# Patient Record
Sex: Male | Born: 1960 | Race: White | Hispanic: No | Marital: Single | Smoking: Current some day smoker
Health system: Southern US, Community
[De-identification: ages and names within clinical notes are randomized; demographics above are authoritative.]

## PROBLEM LIST (undated history)

## (undated) DIAGNOSIS — J939 Pneumothorax, unspecified: Secondary | ICD-10-CM

## (undated) DIAGNOSIS — J189 Pneumonia, unspecified organism: Secondary | ICD-10-CM

## (undated) DIAGNOSIS — J45909 Unspecified asthma, uncomplicated: Secondary | ICD-10-CM

## (undated) HISTORY — PX: OTHER SURGICAL HISTORY: SHX169

## (undated) HISTORY — DX: Unspecified asthma, uncomplicated: J45.909

## (undated) HISTORY — DX: Pneumonia, unspecified organism: J18.9

---

## 2011-03-12 ENCOUNTER — Telehealth: Payer: Self-pay

## 2011-03-12 NOTE — Telephone Encounter (Signed)
LMOM to call.

## 2011-03-17 NOTE — Telephone Encounter (Signed)
Called, LMOM to call. Letter mailed also.

## 2011-03-19 ENCOUNTER — Telehealth: Payer: Self-pay

## 2011-03-19 NOTE — Telephone Encounter (Signed)
Pt returned call and left message to call him around 9:00 AM on Thurs and he will return the call. ( he can't have a phone at the site of the job he is on.)

## 2011-03-20 NOTE — Telephone Encounter (Signed)
I called pt about 9:30 AM and he said that he will call me back after 2:00 PM. Today.

## 2011-03-27 NOTE — Telephone Encounter (Signed)
Letter mailed to pt to call.  

## 2013-05-17 ENCOUNTER — Ambulatory Visit (INDEPENDENT_AMBULATORY_CARE_PROVIDER_SITE_OTHER): Payer: BC Managed Care – PPO | Admitting: General Surgery

## 2013-05-23 ENCOUNTER — Ambulatory Visit (INDEPENDENT_AMBULATORY_CARE_PROVIDER_SITE_OTHER): Payer: BC Managed Care – PPO | Admitting: General Surgery

## 2013-05-23 ENCOUNTER — Encounter (INDEPENDENT_AMBULATORY_CARE_PROVIDER_SITE_OTHER): Payer: Self-pay | Admitting: General Surgery

## 2013-05-23 VITALS — BP 134/76 | HR 72 | Temp 97.4°F | Ht 67.0 in | Wt 187.0 lb

## 2013-05-23 DIAGNOSIS — L723 Sebaceous cyst: Secondary | ICD-10-CM

## 2013-05-23 NOTE — Progress Notes (Signed)
Patient ID: Paul Mcdaniel, male   DOB: 21-Dec-1960, 53 y.o.   MRN: [id number]  Chief Complaint  Patient presents with  . eval knot on back    HPI Paul Mcdaniel is a 53 y.o. male.  The patient is a 53 year old white male who has had a knot on his left chest and back for a number of years. Recently he has been wearing harness at work and these areas have been getting rubbed by the harness. He has not had any fevers or discharge from either of these areas. Because they are starting to rub on his clothing he would like to have them removed.  HPI  Past Medical History  Diagnosis Date  . Asthma     History reviewed. No pertinent past surgical history.  History reviewed. No pertinent family history.  Social History History  Substance Use Topics  . Smoking status: Current Some Day Smoker -- 0.50 packs/day    Types: Cigarettes  . Smokeless tobacco: Not on file  . Alcohol Use: No    No Known Allergies  No current outpatient prescriptions on file.   No current facility-administered medications for this visit.    Review of Systems Review of Systems  Constitutional: Negative.   HENT: Negative.   Eyes: Negative.   Respiratory: Negative.   Cardiovascular: Negative.   Gastrointestinal: Negative.   Endocrine: Negative.   Genitourinary: Negative.   Musculoskeletal: Negative.   Skin: Negative.   Allergic/Immunologic: Negative.   Neurological: Negative.   Hematological: Negative.   Psychiatric/Behavioral: Negative.     Blood pressure 134/76, pulse 72, temperature 97.4 F (36.3 C), height 5\' 7"  (1.702 m), weight 187 lb (84.823 kg).  Physical Exam Physical Exam  Constitutional: He is oriented to person, place, and time. He appears well-developed and well-nourished.  HENT:  Head: Normocephalic and atraumatic.  Eyes: Conjunctivae and EOM are normal. Pupils are equal, round, and reactive to light.  Neck: Normal range of motion. Neck supple.  Cardiovascular: Normal rate, regular  rhythm and normal heart sounds.   Pulmonary/Chest: Effort normal and breath sounds normal.  There is a 2 cm mobile sebaceous cyst in the left upper chest and the shoulder. There is also about a 3 cm sebaceous cyst on the mid back. There is no sign of infection at either site.  Abdominal: Soft. Bowel sounds are normal.  Musculoskeletal: Normal range of motion.  Neurological: He is alert and oriented to person, place, and time.  Skin: Skin is warm and dry.  Psychiatric: He has a normal mood and affect. His behavior is normal.    Data Reviewed As above  Assessment    The patient has a moderate to large size sebaceous cyst on his left chest and mid back that causing significant irritation. Because of this he would like to have these removed and I think this is a reasonable thing to do. I have explained to him in detail the risks and benefits of the operation to remove the cyst as well as some of the technical aspects and he understands and wishes to proceed     Plan    Plan for excision of sebaceous cyst from the left chest wall and mid back        Caleen EssexPaul S Toth III 05/23/2013, 10:37 AM

## 2014-02-07 ENCOUNTER — Telehealth: Payer: Self-pay

## 2014-02-07 NOTE — Telephone Encounter (Signed)
Pt called and said that he is having some breathing problems at this time, for the last couple of weeks, due to some chemicals he used at work. He will see how his treatments go and call me back around the first of February to try to schedule in early March.  He will also let [facility] know that he has communicated with me. His address had changed and he did not get the letters.

## 2014-02-07 NOTE — Telephone Encounter (Signed)
Per Ginger, [facility] inquiring about pt's colonoscopy. He has had several letters sent ( several referrals) and last was 11/24/2013. He has not responded to any of them and I have just left a VM for him to call.

## 2014-03-14 NOTE — Telephone Encounter (Signed)
Pt has new insurance and will let me know in a couple of weeks about scheduling colonoscopy.

## 2014-04-18 ENCOUNTER — Telehealth: Payer: Self-pay

## 2014-04-18 NOTE — Telephone Encounter (Signed)
LMOM for pt to call to schedule colonoscopy. Also mailed a letter for him to call. [facility]

## 2015-01-05 ENCOUNTER — Encounter (HOSPITAL_COMMUNITY): Payer: Self-pay | Admitting: Emergency Medicine

## 2015-01-05 ENCOUNTER — Emergency Department (HOSPITAL_COMMUNITY): Payer: 59

## 2015-01-05 ENCOUNTER — Emergency Department (HOSPITAL_COMMUNITY)
Admission: EM | Admit: 2015-01-05 | Discharge: 2015-01-05 | Disposition: A | Discharge status: Home / Self Care | Payer: 59 | Attending: Emergency Medicine | Admitting: Emergency Medicine

## 2015-01-05 DIAGNOSIS — Y9389 Activity, other specified: Secondary | ICD-10-CM | POA: Insufficient documentation

## 2015-01-05 DIAGNOSIS — F17210 Nicotine dependence, cigarettes, uncomplicated: Secondary | ICD-10-CM | POA: Insufficient documentation

## 2015-01-05 DIAGNOSIS — X58XXXA Exposure to other specified factors, initial encounter: Secondary | ICD-10-CM | POA: Insufficient documentation

## 2015-01-05 DIAGNOSIS — J45909 Unspecified asthma, uncomplicated: Secondary | ICD-10-CM | POA: Insufficient documentation

## 2015-01-05 DIAGNOSIS — Y9289 Other specified places as the place of occurrence of the external cause: Secondary | ICD-10-CM | POA: Insufficient documentation

## 2015-01-05 DIAGNOSIS — S8991XA Unspecified injury of right lower leg, initial encounter: Secondary | ICD-10-CM | POA: Insufficient documentation

## 2015-01-05 DIAGNOSIS — Y998 Other external cause status: Secondary | ICD-10-CM | POA: Insufficient documentation

## 2015-01-05 HISTORY — DX: Pneumothorax, unspecified: J93.9

## 2015-01-05 MED ORDER — IBUPROFEN 800 MG PO TABS: 800.0000 mg | ORAL_TABLET | Freq: Three times a day (TID) | ORAL | Status: DC

## 2015-01-05 NOTE — ED Notes (Signed)
Pt reports injuring RT knee 1 week ago. Pt reports constant pain and edema unrelieved by elevation, ice, or Aleve. Pain increases with movement. Pt ambulatory.

## 2015-01-05 NOTE — ED Provider Notes (Signed)
CSN: [id number]     Arrival date & time 01/05/15  0831 History  By signing my name below, I, Paul Mcdaniel, attest that this documentation has been prepared under the direction and in the presence of No att. providers found. Electronically Signed: Marica Mcdaniel, ED Scribe. 01/05/2015. 8:53 AM.   Chief Complaint  Patient presents with  . Knee Injury  The history is provided by the patient. No language interpreter was used.   PCP: Pershing Proud HPI Comments: Paul Mcdaniel is a 54 y.o. male who presents to the Emergency Department complaining of traumatic, sudden onset, intermittent, worsening left knee pain onset 8 days ago after stepping off a ladder wrong. Associated Sx include intermittent swelling of left knee after long periods of standing. Pt states while he is able to ambulate, walking and putting weight on the leg makes the pain worse. Pt reports taking Aleve and keeping left leg elevated at home with some relief. Pt denies foot pain, hip pain, abd pain, back pain or any other pain at this time.  Past Medical History  Diagnosis Date  . Asthma   . Pneumothorax    History reviewed. No pertinent past surgical history. No family history on file. Social History  Substance Use Topics  . Smoking status: Current Some Day Smoker -- 0.50 packs/day    Types: Cigarettes  . Smokeless tobacco: None  . Alcohol Use: No    Review of Systems A complete 10 system review of systems was obtained and all systems are negative except as noted in the HPI and PMH.   Allergies  Review of patient's allergies indicates no known allergies.  Home Medications   Prior to Admission medications   Medication Sig Start Date End Date Taking? Authorizing Provider  ibuprofen (ADVIL,MOTRIN) 800 MG tablet Take 1 tablet (800 mg total) by mouth 3 (three) times daily. 01/05/15   Glynn Octave, MD   Triage Vitals: BP 140/91 mmHg  Pulse 98  Temp(Src) 97.7 F (36.5 C) (Oral)  Resp 20  Ht  (1.702 m)   Wt 180 lb (81.647 kg)  BMI 28.19 kg/m2  SpO2 100% Physical Exam  Constitutional: He is oriented to person, place, and time. He appears well-developed and well-nourished. No distress.  HENT:  Head: Normocephalic and atraumatic.  Mouth/Throat: Oropharynx is clear and moist. No oropharyngeal exudate.  Eyes: Conjunctivae and EOM are normal. Pupils are equal, round, and reactive to light.  Neck: Normal range of motion. Neck supple.  No meningismus.  Cardiovascular: Normal rate, regular rhythm, normal heart sounds and intact distal pulses.   No murmur heard. Pulmonary/Chest: Effort normal and breath sounds normal. No respiratory distress.  Abdominal: Soft. There is no tenderness. There is no rebound and no guarding.  Musculoskeletal: He exhibits tenderness. He exhibits no edema.       Right knee: He exhibits decreased range of motion (secondary to pain). He exhibits no effusion. Tenderness found. Lateral joint line tenderness noted.  Left Knee: flexion and extension intact. DP and PT pulses intact. No calf tenderness.  Neurological: He is alert and oriented to person, place, and time. No cranial nerve deficit. He exhibits normal muscle tone. Coordination normal.  No ataxia on finger to nose bilaterally. No pronator drift. 5/5 strength throughout. CN 2-12 intact.Equal grip strength. Sensation intact.   Skin: Skin is warm.  Psychiatric: He has a normal mood and affect. His behavior is normal.  Nursing note and vitals reviewed.   ED Course  Procedures (including critical care time)  DIAGNOSTIC STUDIES: Oxygen Saturation is 100% on ra, nl by my interpretation.    COORDINATION OF CARE: 8:47 AM: Discussed treatment plan which includes imaging with pt at bedside; patient verbalizes understanding and agrees with treatment plan. Pt also offered meds for pain which he declined at this time.   Imaging Review Dg Knee Complete 4 Views Right  01/05/2015  CLINICAL DATA:  Twisting injury last week with  persistent posterolateral knee pain. Limited weight-bearing. EXAM: RIGHT KNEE - COMPLETE 4+ VIEW COMPARISON:  None. FINDINGS: The mineralization and alignment are normal. There is no evidence of acute fracture or dislocation. There are tricompartmental degenerative changes with small osteophytes. No significant knee joint effusion is present. There is a possible chondroid lesion in the proximal tibia. Mild spurring at the quadriceps insertion on the patella noted. IMPRESSION: No acute osseous findings or significant joint effusion. Mild tricompartmental degenerative changes. Electronically Signed   By: Carey BullocksWilliam  Veazey M.D.   On: 01/05/2015 09:12   I have personally reviewed and evaluated these images as part of my medical decision-making.  MDM   Final diagnoses:  Right knee injury, initial encounter   Right knee pain for past 1 week after stepping off of ladder. Pain is constant and worse with weightbearing. He is neurovascularly intact. There is no ligament laxity.  X-rays negative. Neurovascularly intact. Patient given knee immobilizer. He refuses crutches and pain medication.  Recommend rice, elevation, follow-up with sports medicine orthopedics. Return precautions discussed.   I personally performed the services described in this documentation, which was scribed in my presence. The recorded information has been reviewed and is accurate.    Glynn OctaveStephen Kouper Spinella, MD 01/05/15 1524

## 2015-01-05 NOTE — Discharge Instructions (Signed)

## 2015-01-05 NOTE — ED Notes (Signed)
MD at bedside. 

## 2015-01-09 ENCOUNTER — Ambulatory Visit (INDEPENDENT_AMBULATORY_CARE_PROVIDER_SITE_OTHER): Payer: 59 | Admitting: Orthopedic Surgery

## 2015-01-09 ENCOUNTER — Encounter: Payer: Self-pay | Admitting: Orthopedic Surgery

## 2015-01-09 ENCOUNTER — Telehealth: Payer: Self-pay | Admitting: *Deleted

## 2015-01-09 VITALS — Ht 67.0 in | Wt 180.0 lb

## 2015-01-09 DIAGNOSIS — S83281A Other tear of lateral meniscus, current injury, right knee, initial encounter: Secondary | ICD-10-CM | POA: Diagnosis not present

## 2015-01-09 DIAGNOSIS — S8391XA Sprain of unspecified site of right knee, initial encounter: Secondary | ICD-10-CM | POA: Diagnosis not present

## 2015-01-09 DIAGNOSIS — M25561 Pain in right knee: Secondary | ICD-10-CM | POA: Diagnosis not present

## 2015-01-09 MED ORDER — NAPROXEN 500 MG PO TABS: 500.0000 mg | ORAL_TABLET | Freq: Two times a day (BID) | ORAL | Status: DC

## 2015-01-09 NOTE — Telephone Encounter (Signed)
Patient called stating he was going to [facility] but they cannot see him until next month, patient is requesting to go somewhere else so he can start therapy sooner, patient is requesting to go to [place], [place], patient would like to be close to home since [facility].

## 2015-01-09 NOTE — Patient Instructions (Addendum)
Call [facility] therapy dept to schedule therapy visits [phone]  Medication sent to your pharmacy  Wear brace x 4 weeks  Return to work 01/10/15 no climbing

## 2015-01-09 NOTE — Progress Notes (Signed)
  Paul Mcdaniel is a 54 y.o. male   HPI:  Knee Pain: 54 year old male stepped off a ladder in his foot hip-he slid twisting his right knee. He presents with no history of prior symptoms in the right knee. Approximately one week after injury went to the emergency room x-rays there showed 3 compartment arthritis without effusion or osseous acute findings. I reviewed the x-ray and I have an independent report at the end of the documentation. Pain is on the right knee lateral aspect and posterior aspect of the knee at the dull ache it's mild to moderate. Duration as stated. Context stated. Modifying factors appear to be weightbearing.  Treatment to this point include ibuprofen which he thinks might of made his throat swell or at least feel funny any stopped. He was placed in a knee immobilizer and has continued to work.  Review of systems denies fever chills swelling warmth to the joint. Denies numbness or tingling.  Past Medical History  Diagnosis Date  . Asthma   . Pneumothorax    No past surgical history reported   Current outpatient prescriptions:  .  ibuprofen (ADVIL,MOTRIN) 800 MG tablet, Take 1 tablet (800 mg total) by mouth 3 (three) times daily., Disp: 21 tablet, Rfl: 0 .  naproxen sodium (ANAPROX) 220 MG tablet, Take 220 mg by mouth 2 (two) times daily with a meal., Disp: , Rfl:  .  naproxen (NAPROSYN) 500 MG tablet, Take 1 tablet (500 mg total) by mouth 2 (two) times daily with a meal., Disp: 56 tablet, Rfl: 0 Allergies  Allergen Reactions  . Ibuprofen Swelling    Patient states after taking Ibuprofen his throat started feeling tight    reports that he has been smoking Cigarettes.  He has been smoking about 0.50 packs per day. He does not have any smokeless tobacco history on file. He reports that he does not drink alcohol or use illicit drugs. No family history on file.  Left Knee: Normal to inspection with no erythema or effusion or obvious bony abnormalities. Palpation normal  with no warmth or joint line tenderness or patellar tenderness or condyle tenderness. ROM normal in flexion and extension and lower leg rotation. Ligaments with solid consistent endpoints including ACL, PCL, LCL, MCL. Negative Mcmurray's and provocative meniscal tests. Non painful patellar compression. Patellar and quadriceps tendons unremarkable. Hamstring and quadriceps strength is normal.  Right knee inspection tenderness over the lateral joint line posterior lateral joint. Varus knee. No warmth. No patellar tenderness. Range of motion remains normal. Ligaments remain consistently solid with firm endpoints anterior cruciate ligament, PCL, LCL, MCL. Negative McMurray's and provocative meniscal tears. Mild pain with patellar compression. Quadriceps and patellar tendons are nontender and strength is normal.  Neurovascular exam remains intact in both lower extremities with no peripheral edema and warm extremities to touch.  Independent x-ray interpretation 3 compartment arthritis varus knee alignment  Impression knee pain possible sprain possible meniscal tear  Recommend the following Stop ibuprofen start Naprosyn 500 twice a day Hinged knee brace full weightbearing as tolerated Physical therapy 3 times a week for 4 weeks Return to work on 01/10/2015 no climbing Follow-up 4 weeks

## 2015-01-09 NOTE — Telephone Encounter (Signed)
I called patient and made him aware that his physical therapy [facility]. I also provided him with the number [phone].

## 2015-01-09 NOTE — Addendum Note (Signed)
Addended by: Adella HareBOOTHE, Madelon Welsch B on: 01/09/2015 02:43 PM   Modules accepted: Orders

## 2015-01-09 NOTE — Telephone Encounter (Signed)
Please fax new [facility] to [facility] (I just printed) and let patient know, thank you

## 2015-01-16 ENCOUNTER — Ambulatory Visit: Payer: 59 | Attending: Orthopedic Surgery | Admitting: Physical Therapy

## 2015-01-16 DIAGNOSIS — M25561 Pain in right knee: Secondary | ICD-10-CM | POA: Insufficient documentation

## 2015-01-16 DIAGNOSIS — R29898 Other symptoms and signs involving the musculoskeletal system: Secondary | ICD-10-CM | POA: Insufficient documentation

## 2015-01-16 DIAGNOSIS — M24661 Ankylosis, right knee: Secondary | ICD-10-CM | POA: Diagnosis present

## 2015-01-16 NOTE — Therapy (Signed)
[facility] [address] [place], [address] Phone: [phone]   Fax:  [phone]  Physical Therapy Evaluation  Patient Details  Name: Paul Mcdaniel MRN: [id number] Date of Birth: April 21, 1960 Referring Provider: Fuller Canada  Encounter Date: 01/16/2015      PT End of Session - 01/16/15 1746    Visit Number 1   Number of Visits 9   Date for PT Re-Evaluation 02/13/15   PT Start Time 1700   PT Stop Time 1730   PT Time Calculation (min) 30 min   Activity Tolerance Patient tolerated treatment well;Patient limited by pain   Behavior During Therapy [facility] for tasks assessed/performed      Past Medical History  Diagnosis Date  . Asthma   . Pneumothorax     No past surgical history on file.  There were no vitals filed for this visit.  Visit Diagnosis:  Right knee pain - Plan: PT plan of care cert/re-cert  Weakness of right leg - Plan: PT plan of care cert/re-cert  Decreased range of motion of knee, right - Plan: PT plan of care cert/re-cert      Subjective Assessment - 01/16/15 1733    Subjective Patient reports he was told that he had to do therapy before an MRI would be done. Wants to get a second opinion. Feels as though his knee is very unstable and he has been in severe pain.    Limitations Standing;Walking;House hold activities;Other (comment)  work duties   How long can you stand comfortably? <30 min   How long can you walk comfortably? <5 min   Diagnostic tests Xrays negative for fracture   Patient Stated Goals To decreased pain, get back to normal.    Currently in Pain? Yes   Pain Score 6    Pain Location Knee   Pain Orientation Right   Pain Descriptors / Indicators Sore;Tender   Pain Type Acute pain   Pain Onset 1 to 4 weeks ago   Pain Frequency Constant   Aggravating Factors  moving   Pain Relieving Factors rest   Effect of Pain on Daily Activities has difficulty walking, cannot  climb ladder at work.   Multiple Pain Sites No            [facility] PT Assessment - 01/16/15 0001    Assessment   Medical Diagnosis right knee sprain   Referring Provider Fuller Canada   Onset Date/Surgical Date 01/05/15   Next MD Visit January   Prior Therapy None   Precautions   Precautions Knee   Required Braces or Orthoses Other Brace/Splint   Restrictions   Weight Bearing Restrictions No   Balance Screen   Has the patient fallen in the past 6 months No   Has the patient had a decrease in activity level because of a fear of falling?  Yes   Is the patient reluctant to leave their home because of a fear of falling?  No   Home [employer] information [employer] residence   Living Arrangements Other (Comment);Non-relatives/Friends   Type of Home House   Home Access Ramped entrance   Home Layout One level   Home Equipment None   Prior Function   Level of Independence Independent   Vocation Full time employment   Vocation Requirements electrician   Cognition   Overall Cognitive Status Within Functional Limits for tasks assessed   Sensation   Light Touch Appears Intact  ROM / Strength   AROM / PROM / Strength AROM;Strength   AROM   Overall AROM  Deficits   Overall AROM Comments decreased right knee flexion, decreased knee extension   AROM Assessment Site Knee   Right/Left Knee Right   Right Knee Extension 10   Right Knee Flexion 70   Strength   Overall Strength Deficits;Due to pain   Overall Strength Comments 2/5 grossly throughout   Strength Assessment Site Knee   Right/Left Knee Right   Right Knee Flexion 2+/5   Right Knee Extension 3-/5   Palpation   Patella mobility normal   Ambulation/Gait   Gait Pattern Step-to pattern;Right hip hike;Right circumduction;Decreased weight shift to right;Decreased hip/knee flexion - right         Treatment this session to include:  Supine exercises to include: heel slides, AP, saq, SLR, and hip add/abd x 10 reps  as able.           PT Education - 01/16/15 1745    Education provided Yes   Education Details POC, decisions to make regarding second opinion, determining what insurance co. requires. HEP   Person(s) Educated Patient   Methods Explanation;Demonstration;Handout;Verbal cues   Comprehension Verbalized understanding;Returned demonstration             PT Long Term Goals - 01/16/15 1751    PT LONG TERM GOAL #1   Title Patient will demonstrate improved functional independence with LEFS socre of > 50/80   Baseline 23/80   Time 4   Period Weeks   Status New   PT LONG TERM GOAL #2   Title Patient will report decreased pain of < 4/10   Baseline 6/10   Time 4   Period Weeks   Status New   PT LONG TERM GOAL #3   Title Patient will demonstrate improved AROM of right knee to WNL.    Baseline 10-70   Time 4   Period Weeks   Status New               Plan - 01/16/15 1747    Clinical Impression Statement Patient is a 54 year old male who reports stepping down off ladder at work and his right leg slid out to the side. Waited several day before seeking medical attention. Xrays taken. Pt was told his insurance co. requires that he goes to PT prior to having MRI done.  He is looking into this. Reports severe pain, demonstrates decreased strength, decreased range of motion and decreased stability of right knee.    Pt will benefit from skilled therapeutic intervention in order to improve on the following deficits Abnormal gait;Decreased activity tolerance;Decreased balance;Decreased strength;Difficulty walking;Decreased range of motion;Pain   Rehab Potential Good   PT Frequency 2x / week   PT Duration 4 weeks   PT Treatment/Interventions Therapeutic exercise;Gait training;Neuromuscular re-education;Passive range of motion   PT Next Visit Plan range of motion, gentle strengthening   PT Home Exercise Plan saq, heel slides, abd/add hip if able, slr if able, calf stretch ( gentle)    Consulted and Agree with Plan of Care Patient         Problem List Patient Active Problem List   Diagnosis Date Noted  . Sebaceous cyst 05/23/2013    Ricka Westra, PT, MPT, GCS 01/16/2015, 5:57 PM  [facility] [address] [place], [place], [id number] Phone: [phone]   Fax:  [phone]  Name: Paul Mcdaniel MRN: [id number]  Date of Birth: 1960-11-06

## 2015-01-17 ENCOUNTER — Other Ambulatory Visit: Payer: Self-pay | Admitting: *Deleted

## 2015-01-17 ENCOUNTER — Telehealth: Payer: Self-pay | Admitting: *Deleted

## 2015-01-17 MED ORDER — TRAMADOL-ACETAMINOPHEN 37.5-325 MG PO TABS: 1.0000 | ORAL_TABLET | ORAL | Status: DC | PRN

## 2015-01-17 NOTE — Telephone Encounter (Signed)
Patient called and states he is still working 8 hours a day and started therapy yesterday. States Naprosyn is not giving him relief and asks if there is anything else you can prescribe him for pain.

## 2015-01-17 NOTE — Telephone Encounter (Signed)
ultracet 1 q 4 prn pain # 30

## 2015-01-18 ENCOUNTER — Ambulatory Visit: Payer: 59 | Admitting: Physical Therapy

## 2015-01-18 NOTE — Telephone Encounter (Signed)
Prescription faxed to pharmacy noted in chart  Called patient, no answer, left vm

## 2015-01-23 ENCOUNTER — Other Ambulatory Visit: Payer: Self-pay | Admitting: *Deleted

## 2015-01-23 ENCOUNTER — Telehealth: Payer: Self-pay | Admitting: *Deleted

## 2015-01-23 MED ORDER — NABUMETONE 500 MG PO TABS: 500.0000 mg | ORAL_TABLET | Freq: Two times a day (BID) | ORAL | Status: DC

## 2015-01-23 NOTE — Telephone Encounter (Signed)
Patient called stating Dr. Romeo AppleHarrison called in [place] and it is hurting his stomach. Patient is requesting hydrocodone, patient said he is still doing his therapy and working, just the medication keeps his stomach hurting. Please advise

## 2015-01-23 NOTE — Telephone Encounter (Signed)
Routing to Dr Harrison for review 

## 2015-01-23 NOTE — Telephone Encounter (Signed)
Medication sent to pharmacy, patient aware. 

## 2015-01-23 NOTE — Telephone Encounter (Signed)
Please try thousand milligrams Tylenol every 8 hours and 500 mg of Relafen every 12 hours

## 2015-01-23 NOTE — Telephone Encounter (Signed)
Routing to Jaime  

## 2015-01-24 NOTE — Telephone Encounter (Signed)
Noted  

## 2015-01-25 ENCOUNTER — Ambulatory Visit: Payer: 59 | Admitting: Physical Therapy

## 2015-01-25 DIAGNOSIS — M25561 Pain in right knee: Secondary | ICD-10-CM | POA: Diagnosis not present

## 2015-01-25 DIAGNOSIS — M24661 Ankylosis, right knee: Secondary | ICD-10-CM

## 2015-01-25 DIAGNOSIS — R29898 Other symptoms and signs involving the musculoskeletal system: Secondary | ICD-10-CM

## 2015-01-25 NOTE — Therapy (Signed)
[facility]IC[facility] [address] [place], [place], [address] Phone: [phone]   Fax:  [phone]  Physical Therapy Treatment  Patient Details  Name: Paul Mcdaniel MRN: [id number] Date of Birth: 14-Jun-1960 Referring Provider: Fuller CanadaStanley Harrison  Encounter Date: 01/25/2015      PT End of Session - 01/25/15 1755    Visit Number 2   Number of Visits 9   Date for PT Re-Evaluation 02/13/15   PT Start Time 1700   PT Stop Time 1730   PT Time Calculation (min) 30 min   Activity Tolerance Patient tolerated treatment well;No increased pain;Patient limited by pain   Behavior During Therapy [facility] for tasks assessed/performed      Past Medical History  Diagnosis Date  . Asthma   . Pneumothorax     No past surgical history on file.  There were no vitals filed for this visit.  Visit Diagnosis:  Right knee pain  Weakness of right leg  Decreased range of motion of knee, right      Subjective Assessment - 01/25/15 1753    Subjective Patient reports continued severe pain in right knee. Has not been able to see another MD yet. Continues to wear brace and work. Trying to do exercises at home that he was given last time.    Limitations Standing;Walking;House hold activities;Other (comment)   Patient Stated Goals To decreased pain, get back to normal.    Currently in Pain? Yes   Pain Score 6    Pain Location Knee   Pain Orientation Right   Pain Descriptors / Indicators Sore;Tender   Pain Type Acute pain   Pain Onset 1 to 4 weeks ago   Pain Frequency Constant   Multiple Pain Sites No         Treatment this session to include: visual inspection of right knee. Continued bruising present over lateral and proximal right knee. Supine exercises to include: heel slides, AP, quad sets, saq, SLR, and hip add/abd x 10 reps as able. Cues given to use muscles as he can, but not to push it too much.  Patient is still tying to get in  for MRI to determine damage to right knee.              PT Education - 01/25/15 1754    Education provided Yes   Education Details HEP, safety regarding knee, healing process.   Person(s) Educated Patient   Methods Explanation;Verbal cues   Comprehension Verbalized understanding;Returned demonstration             PT Long Term Goals - 01/16/15 1751    PT LONG TERM GOAL #1   Title Patient will demonstrate improved functional independence with LEFS socre of > 50/80   Baseline 23/80   Time 4   Period Weeks   Status New   PT LONG TERM GOAL #2   Title Patient will report decreased pain of < 4/10   Baseline 6/10   Time 4   Period Weeks   Status New   PT LONG TERM GOAL #3   Title Patient will demonstrate improved AROM of right knee to WNL.    Baseline 10-70   Time 4   Period Weeks   Status New               Plan - 01/25/15 1756    Clinical Impression Statement Patient continues to be significantly limited by right knee pain. Pt has been working  and wearing knee brace. He has been performing HEP.    Pt will benefit from skilled therapeutic intervention in order to improve on the following deficits Abnormal gait;Decreased activity tolerance;Decreased balance;Decreased strength;Difficulty walking;Decreased range of motion;Pain   Rehab Potential Good   PT Frequency 2x / week   PT Duration 4 weeks   PT Treatment/Interventions Therapeutic exercise;Gait training;Neuromuscular re-education;Passive range of motion   PT Next Visit Plan range of motion, gentle strengthening   PT Home Exercise Plan saq, heel slides, abd/add hip if able, slr if able, calf stretch ( gentle)   Consulted and Agree with Plan of Care Patient        Problem List Patient Active Problem List   Diagnosis Date Noted  . Sebaceous cyst 05/23/2013    Kadyn Guild, PT, MPT, GCS 01/25/2015, 5:59 PM  [facility] [address] [place], [address] Phone: [phone]   Fax:  [phone]  Name: Paul Mcdaniel MRN: [id number] Date of Birth: Jun 06, 1960

## 2015-01-30 ENCOUNTER — Ambulatory Visit: Payer: 59 | Attending: Orthopedic Surgery | Admitting: Physical Therapy

## 2015-02-06 ENCOUNTER — Ambulatory Visit: Payer: 59 | Admitting: Orthopedic Surgery

## 2015-02-06 ENCOUNTER — Encounter: Payer: 59 | Admitting: Physical Therapy

## 2015-02-13 ENCOUNTER — Encounter: Payer: 59 | Admitting: Physical Therapy

## 2015-02-20 ENCOUNTER — Ambulatory Visit: Payer: 59 | Admitting: Physical Therapy

## 2015-09-20 ENCOUNTER — Telehealth: Payer: Self-pay

## 2015-09-20 NOTE — Telephone Encounter (Signed)
LMOM for a return call to screen for a colonoscopy.

## 2015-10-08 NOTE — Telephone Encounter (Signed)
LMOM to call.

## 2015-10-09 NOTE — Telephone Encounter (Signed)
Letter mailed to pt and fax to PCP.

## 2015-11-22 ENCOUNTER — Ambulatory Visit (INDEPENDENT_AMBULATORY_CARE_PROVIDER_SITE_OTHER): Payer: 59 | Admitting: Otolaryngology

## 2016-01-28 DIAGNOSIS — J189 Pneumonia, unspecified organism: Secondary | ICD-10-CM

## 2016-01-28 HISTORY — DX: Pneumonia, unspecified organism: J18.9

## 2016-02-22 ENCOUNTER — Ambulatory Visit (HOSPITAL_COMMUNITY)
Admission: RE | Admit: 2016-02-22 | Discharge: 2016-02-22 | Disposition: A | Discharge status: Home / Self Care | Payer: 59 | Source: Ambulatory Visit | Attending: Family Medicine | Admitting: Family Medicine

## 2016-02-22 ENCOUNTER — Other Ambulatory Visit (HOSPITAL_COMMUNITY): Payer: Self-pay | Admitting: Family Medicine

## 2016-02-22 DIAGNOSIS — J438 Other emphysema: Secondary | ICD-10-CM

## 2016-02-22 DIAGNOSIS — J111 Influenza due to unidentified influenza virus with other respiratory manifestations: Secondary | ICD-10-CM | POA: Insufficient documentation

## 2016-02-22 DIAGNOSIS — Z6825 Body mass index (BMI) 25.0-25.9, adult: Secondary | ICD-10-CM | POA: Insufficient documentation

## 2016-02-22 DIAGNOSIS — J449 Chronic obstructive pulmonary disease, unspecified: Secondary | ICD-10-CM | POA: Insufficient documentation

## 2016-03-17 NOTE — Telephone Encounter (Signed)
Patient called in to schedule his screening tcs.  You can reach him at [phone].

## 2016-03-18 ENCOUNTER — Telehealth: Payer: Self-pay

## 2016-03-18 NOTE — Telephone Encounter (Signed)
Triaged pt today.  

## 2016-03-21 NOTE — Telephone Encounter (Signed)
MOVI PREP SPLIT DOSING-full LIQUIDS WITH BREAKFAST.  Full Liquid Diet A high-calorie, high-protein supplement should be used to meet your nutritional requirements when the full liquid diet is continued for more than 2 or 3 days. If this diet is to be used for an extended period of time (more than 7 days), a multivitamin should be considered.  Breads and Starches  Allowed: None are allowed   Avoid: Any others.    Potatoes/Pasta/Rice  Allowed: ANY ITEM AS A SOUP OR SMALL PLATE OF MASHED POTATOES OR SCRAMBLED EGGS. (DO NOT EAT MORE THAN ONE SERVING ON THE DAY BEFORE COLONOSCOPY).      Vegetables  Allowed: Strained tomato or vegetable juice. Vegetables pureed in soup.   Avoid: Any others.    Fruit  Allowed: Any strained fruit juices and fruit drinks. Include 1 serving of citrus or vitamin C-enriched fruit juice daily.   Avoid: Any others.  Meat and Meat Substitutes  Allowed: Egg  Avoid: Any meat, fish, or fowl. All cheese.  Milk  Allowed: SOY Milk beverages, including milk shakes and instant breakfast mixes. Smooth yogurt.   Avoid: Any others. Avoid dairy products if not tolerated.    Soups and Combination Foods  Allowed: Broth, strained cream soups. Strained, broth-based soups.   Avoid: Any others.    Desserts and Sweets  Allowed: flavored gelatin, tapioca, ice cream, sherbet, smooth pudding, junket, fruit ices, frozen ice pops, pudding pops, frozen fudge pops, chocolate syrup. Sugar, honey, jelly, syrup.   Avoid: Any others.  Fats and Oils  Allowed: Margarine, butter, cream, sour cream, oils.   Avoid: Any others.  Beverages  Allowed: All.   Avoid: None.  Condiments  Allowed: Iodized salt, pepper, spices, flavorings. Cocoa powder.   Avoid: Any others.    SAMPLE MEAL PLAN Breakfast   cup orange juice.   1 OR 2 EGGS  1 cup milk.   1 cup beverage (coffee or tea).   Cream or sugar, if desired.    Midmorning Snack  2 SCRAMBLED OR HARD  BOILED EGG   Lunch  1 cup cream soup.    cup fruit juice.   1 cup milk.    cup custard.   1 cup beverage (coffee or tea).   Cream or sugar, if desired.    Midafternoon Snack  1 cup milk shake.  Dinner  1 cup cream soup.    cup fruit juice.   1 cup MILK    cup pudding.   1 cup beverage (coffee or tea).   Cream or sugar, if desired.  Evening Snack  1 cup supplement.  To increase calories, add sugar, cream, butter, or margarine if possible. Nutritional supplements will also increase the total calories.

## 2016-03-21 NOTE — Telephone Encounter (Signed)
Gastroenterology Pre-Procedure Review  Request Date: 03/18/2016 Requesting Physician: Terie PurserSamantha Jackson PA  PATIENT REVIEW QUESTIONS: The patient responded to the following health history questions as indicated:    1. Diabetes Melitis: no 2. Joint replacements in the past 12 months: no 3. Major health problems in the past 3 months: Recently had pneumonia 4. Has an artificial valve or MVP: no 5. Has a defibrillator: no 6. Has been advised in past to take antibiotics in advance of a procedure like teeth cleaning: no 7. Family history of colon cancer: no  8. Alcohol Use: no 9. History of sleep apnea: no  10. History of coronary artery or other vascular stents placed within the last 12 months: no    MEDICATIONS & ALLERGIES:    Patient reports the following regarding taking any blood thinners:   Plavix? no Aspirin? no Coumadin? no Brilinta? no Xarelto? no Eliquis? no Pradaxa? no Savaysa? no Effient? no  Patient confirms/reports the following medications:  No current outpatient prescriptions on file.   No current facility-administered medications for this visit.     Patient confirms/reports the following allergies:  Allergies  Allergen Reactions  . Ibuprofen Other (See Comments)    Upsets stomach    No orders of the defined types were placed in this encounter.   AUTHORIZATION INFORMATION Primary Insurance:   ID #:  Group #:  Pre-Cert / Auth required: Pre-Cert / Auth #:   Secondary Insurance:   ID #:  Group #:  Pre-Cert / Auth required:  Pre-Cert / Auth #:   SCHEDULE INFORMATION: Procedure has been scheduled as follows:  Date:               Time:   Location:   This Gastroenterology Pre-Precedure Review Form is being routed to the following provider(s): Jonette EvaSandi Fields, MD

## 2016-03-25 NOTE — Telephone Encounter (Signed)
Looks like patient is on no meds.   Ok to schedule per RMR standard preps and instructions.

## 2016-03-25 NOTE — Telephone Encounter (Signed)
Pt called to say that he wanted appt to be with Dr. Jena Gaussourk. He apologized for not telling me when he was triaged.   Forwarding to Tana CoastLeslie Lewis, PA to sign off for Dr. Jena Gaussourk.

## 2016-03-26 ENCOUNTER — Other Ambulatory Visit: Payer: Self-pay

## 2016-03-26 DIAGNOSIS — Z1211 Encounter for screening for malignant neoplasm of colon: Secondary | ICD-10-CM

## 2016-03-26 MED ORDER — NA SULFATE-K SULFATE-MG SULF 17.5-3.13-1.6 GM/177ML PO SOLN: 1.0000 | ORAL | 0 refills | Status: DC

## 2016-03-26 NOTE — Telephone Encounter (Signed)
LMOM to call for the appt date and time with Dr. Jena Gaussourk.

## 2016-03-26 NOTE — Addendum Note (Signed)
Addended by: Lavena BullionSTEWART, Loria Lacina H on: 03/26/2016 09:20 AM   Modules accepted: Orders

## 2016-03-26 NOTE — Telephone Encounter (Signed)
Pt has been scheduled an appt on 04/17/2016 @ 11:30 Am with Dr. Jena Gaussourk. Rx sent to pharmacy and instructions mailed to pt.

## 2016-03-27 NOTE — Patient Instructions (Signed)
Attempted to submit PA for colonoscopy via [employer] website. No PA needed.

## 2016-04-16 ENCOUNTER — Telehealth: Payer: Self-pay

## 2016-04-16 NOTE — Telephone Encounter (Signed)
Pt called to cancel his colonoscopy with Dr. Jena Gaussourk for tomorrow. He has had a fever and has infection in his left lung. York SpanielSaid he had a collapsed lung at age 56 and he has to be careful about infections. He will cancel and call back when he is ready to reschedule.  Sending FYI to Tana CoastLeslie Lewis, PA who signed off on the triage.

## 2016-04-16 NOTE — Telephone Encounter (Signed)
Noted  

## 2016-04-17 ENCOUNTER — Encounter (HOSPITAL_COMMUNITY): Admission: RE | Payer: Self-pay | Source: Ambulatory Visit

## 2016-04-17 ENCOUNTER — Ambulatory Visit (HOSPITAL_COMMUNITY): Admission: RE | Admit: 2016-04-17 | Payer: 59 | Source: Ambulatory Visit | Admitting: Internal Medicine

## 2016-04-17 SURGERY — COLONOSCOPY
Anesthesia: Moderate Sedation

## 2016-04-23 ENCOUNTER — Telehealth: Payer: Self-pay | Admitting: Gastroenterology

## 2016-04-23 ENCOUNTER — Encounter: Payer: Self-pay | Admitting: Gastroenterology

## 2016-04-23 NOTE — Telephone Encounter (Signed)
Patient's sister, Bonita QuinLinda, called saying that patient had to cancel a procedure and now is wanting to reschedule to have an endoscopy. Does patient need to be triaged or does he need an OV. Please advise. [phone]

## 2016-04-23 NOTE — Telephone Encounter (Signed)
Given he has no chronic lung disease noted on cxr 01/2016, I think it would be ok to proceed with triage if patient reports that his cough/congestion is resolved.   I find it a little strange that his sister is calling with information. If we do not have HIPPA on file to discuss with sister we should speak directly to the patient.

## 2016-04-23 NOTE — Telephone Encounter (Signed)
Paul Mcdaniel, I think pt should have OV prior to scheduling given his recent respiratory problems. Please advise!

## 2016-04-28 ENCOUNTER — Telehealth: Payer: Self-pay | Admitting: Internal Medicine

## 2016-04-28 ENCOUNTER — Other Ambulatory Visit: Payer: Self-pay

## 2016-04-28 ENCOUNTER — Encounter: Payer: Self-pay | Admitting: Nurse Practitioner

## 2016-04-28 ENCOUNTER — Ambulatory Visit (INDEPENDENT_AMBULATORY_CARE_PROVIDER_SITE_OTHER): Payer: 59 | Admitting: Nurse Practitioner

## 2016-04-28 DIAGNOSIS — K625 Hemorrhage of anus and rectum: Secondary | ICD-10-CM

## 2016-04-28 DIAGNOSIS — R1031 Right lower quadrant pain: Secondary | ICD-10-CM | POA: Diagnosis not present

## 2016-04-28 DIAGNOSIS — R109 Unspecified abdominal pain: Secondary | ICD-10-CM

## 2016-04-28 DIAGNOSIS — R110 Nausea: Secondary | ICD-10-CM

## 2016-04-28 DIAGNOSIS — R112 Nausea with vomiting, unspecified: Secondary | ICD-10-CM

## 2016-04-28 MED ORDER — PROMETHAZINE HCL 25 MG PO TABS: 25.0000 mg | ORAL_TABLET | ORAL | 1 refills | Status: AC | PRN

## 2016-04-28 MED ORDER — OMEPRAZOLE 20 MG PO CPDR: 20.0000 mg | DELAYED_RELEASE_CAPSULE | Freq: Every day | ORAL | 3 refills | Status: AC

## 2016-04-28 NOTE — Assessment & Plan Note (Addendum)
Nausea without vomiting and "upset stomach" in the setting of a history of bleeding gastric ulcer remotely. Denies overt GERD symptoms. Could have silent reflux. It seems his symptoms started when he was placed on antibiotic. No persistent diarrhea to be concerned about at this time. Possible GERD/gastritis symptoms. I will start him on Protonix 20 mg once a day to trial for improvement. I will refill his Phenergan and a limited supply for as needed nausea management. Return for follow-up in 2 months. Call if any worsening symptoms before then.  Depending on symptom progression and response to PPI, may need an upper endoscopy in the future to further evaluate.

## 2016-04-28 NOTE — Progress Notes (Signed)
CC'D TO PCP °

## 2016-04-28 NOTE — Addendum Note (Signed)
Addended by: Delane Ginger, Sianna Garofano A on: 04/28/2016 11:38 AM   Modules accepted: Orders

## 2016-04-28 NOTE — Telephone Encounter (Signed)
Patient wanted to know if Paul Mcdaniel can sign his STD paperwork.  His pcp at [facility] was originally on his STD paperwork, however he was told that now we will need to complete his STD paperwork until he has his colonoscopy.    Routing to [employer]

## 2016-04-28 NOTE — Telephone Encounter (Signed)
PT was seen in the office today and scheduled for procedure.

## 2016-04-28 NOTE — Patient Instructions (Signed)
1. I have sent Protonix 20 mg to your pharmacy. Take this once a day, on an empty stomach. 2. We will provide you with a note for your office visit today. 3. We will schedule your colonoscopy with Dr. Jena Gauss and request a low volume prep, if possible. 4. Return for follow-up in 2 months. 5. Call us if you have any worsening symptoms before then.

## 2016-04-28 NOTE — Progress Notes (Signed)
Primary Care Physician:  Pershing Proud Primary Gastroenterologist:  Dr. Jena Gauss  Chief Complaint  Patient presents with  . Rectal Bleeding  . Abdominal Pain    started after taking ABT in Feb, right side, more when eats solid foods  . Nausea    after eating  . Diarrhea    HPI:   Paul Mcdaniel is a 56 y.o. male who presents for abdominal pain, rectal bleeding, nausea, diarrhea. He was previously triage to have a colonoscopy for screening purposes. However, he had to cancel due to respiratory infection. He presents today for the above symptoms and as well to schedule likely colonoscopy as well as possible endoscopy.  Today he states in February he had a respirtory infection and was treated with antibiotics. Afterward began having upset stomach. Has a history of gastric ulcer. Noted bright red rectal bleeding. Also nausea. Phenergan has helped. No rectal bleeding in the past week. Has been taking activa and ensure. Has worsening upset stomach with certain foods, particularly spicy foods. He is wanting [place] to "also check out my gallbladder and appendix." Denies melena. Is currently having RUQ pain, described as "a burning". Denies reflux symptoms. Has lost 6-7 pounds in the last 3 months related to pneumonia, has been stable since February. Has a bowel movement 1-2 times a day, soft and passes easily. Has occasional diarrhea only with foods that have lots of "seasoning and spices" on it. Breathing is good now. Denies chest pain, dyspnea, dizziness, lightheadedness, syncope, near syncope. Denies any other upper or lower GI symptoms.  Has never had a colonoscopy before.  Past Medical History:  Diagnosis Date  . Asthma   . Pneumonia 01/2016  . Pneumothorax     Past Surgical History:  Procedure Laterality Date  . NONE TO DATE      Current Outpatient Prescriptions  Medication Sig Dispense Refill  . promethazine (PHENERGAN) 25 MG tablet Take 25 mg by mouth as needed for nausea or  vomiting.    Marland Kitchen omeprazole (PRILOSEC) 20 MG capsule Take 1 capsule (20 mg total) by mouth daily. 90 capsule 3   No current facility-administered medications for this visit.     Allergies as of 04/28/2016 - Review Complete 04/28/2016  Allergen Reaction Noted  . Ibuprofen Other (See Comments) 01/09/2015    Family History  Problem Relation Age of Onset  . Colon cancer Neg Hx     Social History   Social History  . Marital status: Single    Spouse name: N/A  . Number of children: N/A  . Years of education: N/A   Occupational History  . Not on file.   Social History Main Topics  . Smoking status: Current Some Day Smoker    Packs/day: 0.50    Types: Cigarettes  . Smokeless tobacco: Never Used  . Alcohol use No     Comment: Quit in 2015; Previously weekend/social drinker.  . Drug use: No  . Sexual activity: Not on file   Other Topics Concern  . Not on file   Social History Narrative  . No narrative on file    Review of Systems: General: Negative for anorexia, weight loss, fever, chills, fatigue, weakness. ENT: Negative for hoarseness, difficulty swallowing. CV: Negative for chest pain, angina, palpitations, peripheral edema.  Respiratory: Negative for dyspnea at rest, cough, sputum, wheezing.  GI: See history of present illness. MS: Negative for joint pain, low back pain.  Derm: Negative for rash or itching.  Endo: Negative for unusual  weight change.  Heme: Negative for bruising or bleeding. Allergy: Negative for rash or hives.    Physical Exam: BP 139/84   Pulse 99   Temp 98.1 F (36.7 C) (Oral)   Ht  (1.702 m)   Wt 173 lb 9.6 oz (78.7 kg)   BMI 27.19 kg/m  General:   Alert and oriented. Pleasant and cooperative. Well-nourished and well-developed.  Head:  Normocephalic and atraumatic. Eyes:  Without icterus, sclera clear and conjunctiva pink.  Ears:  Normal auditory acuity. Cardiovascular:  S1, S2 present without murmurs appreciated. Extremities  without clubbing or edema. Respiratory:  Clear to auscultation bilaterally. No wheezes, rales, or rhonchi. No distress.  Gastrointestinal:  +BS, soft, and non-distended. Mild RLQ TTP noted. No HSM noted. No guarding or rebound. No masses appreciated.  Rectal:  Deferred  Musculoskalatal:  Symmetrical without gross deformities. Neurologic:  Alert and oriented x4;  grossly normal neurologically. Psych:  Alert and cooperative. Normal mood and affect. Heme/Lymph/Immune: No excessive bruising noted.    04/28/2016 11:29 AM   Disclaimer: This note was dictated with voice recognition software. Similar sounding words can inadvertently be transcribed and may not be corrected upon review.

## 2016-04-28 NOTE — Telephone Encounter (Signed)
[phone] pl[ease call patient , he has a few questions about his work and his issues medically

## 2016-04-28 NOTE — Assessment & Plan Note (Signed)
The patient describes right-sided abdominal pain which is more localized to the right lower quadrant on exam and is mild in nature without acute abdomen to be concerned for acute appendicitis. This in addition to rectal bleeding without history of colonoscopy now currently age 56 supports the case for colonoscopy. He may need an upper endoscopy in the future pending trial of PPI for symptoms as per above. Return for follow-up in 2 months.

## 2016-04-28 NOTE — Telephone Encounter (Signed)
Patient stated he can't work because he has to take Phenergan to help with his nausea and it makes him drowsy.

## 2016-04-28 NOTE — Assessment & Plan Note (Signed)
Noted rectal bleeding, although none in the past week. Bleeding is bright red. He is currently 6 years overdue for initial colonoscopy. Also with abdominal pain which she states is the right side but locates more right lower quadrant on exam. At this point we'll proceed with colonoscopy to further evaluate enterococcus first-ever surveillance. Return for follow-up in 2 months.  Proceed with TCS with Dr. Jena Gauss in near future: the risks, benefits, and alternatives have been discussed with the patient in detail. The patient states understanding and desires to proceed.  The patient is not on any anticoagulants, anxiolytics, chronic pain medications, or antidepressants. Conscious sedation should be adequate for his procedure.

## 2016-04-29 NOTE — Telephone Encounter (Signed)
Routing to Martina 

## 2016-04-29 NOTE — Telephone Encounter (Signed)
Patient's girlfriend(Ava Carney Bern) called in upset stating we needed to "hurry up and run the light in him to see what's going on".   I explained to her that he did not give [place] permission to discuss his care and I would be unable to speak with her in regards to his care.  She stated she will let Mr. Mclennan know she called and have him call us back.

## 2016-04-29 NOTE — Telephone Encounter (Signed)
I made the patient aware. He stated he will let the STD carrier know that his pcp will need to handle his STD paperwork

## 2016-04-29 NOTE — Telephone Encounter (Signed)
Pt called office and asked if the lady that lives with him had spoke to me on the phone. I informed the pt that I had not spoke to her. He said she was upset and he wanted to apologize to whoever she had spoke to. I looked in his chart and seen that CM had spoke to her. I informed the pt that office manager had spoke to her and I would let her know that he apologizes. Informed him that CM didn't give her any info about his care. Pt is set-up for procedure 05/08/16 and he is fine with that day. Pt is ok with his care received from our office and said that he trusts [place].  CM was informed that he apologized for the way the lady talked to her.

## 2016-04-29 NOTE — Telephone Encounter (Signed)
We are completing a colonoscopy related to rectal bleeding. He admits "upset stomach" since being on antibiotics earlier this year for pneumonia. Has had some mild weight loss (both mild weight loss and upset stomach not uncommon with pneumonia and antibiotics). I started him on Prilosec once daily to see if his nausea is related to silent GERD and also due to his history of PUD. I did refill his Phenergan, but only a limited supply (10 pills if I remember right) until he could follow-up with PCP or have PPI take effect.  At this time the colonoscopy would be done with or without his symptoms due to never having one before at age 56.  I think the STD paperwork should remain with PCP for the time being, until/unless we identify a disabling etiology.

## 2016-05-07 ENCOUNTER — Telehealth: Payer: Self-pay

## 2016-05-07 NOTE — Telephone Encounter (Signed)
Pt called and said that he did not think that he was going to be able to have the TCS tomorrow because he is sick on his stomach. He is wanting to have his stomach looked at first before he has a TCS. He stated that he had to take some anti-bx this winter for the pneumonia and his stomach has not been right since. He is wanting to be seen next week. Please advise

## 2016-05-07 NOTE — Telephone Encounter (Signed)
Pt would like to cancel the TCS for now. I have him an appointment for 06/10/16 @ 8:30 with RMR but he is wanting to be seen sooner if there are any openings.

## 2016-05-07 NOTE — Telephone Encounter (Signed)
I discussed with him when I saw him that his symptoms aren't really classic for an ulcer. He seems fixated that he has an ulcer. He wasn't on a PPI previously and I started him on one. Has had "upset stomach" since abx for non-GI issue. If he wants to cancel his colonoscopy (for rectal bleeding and never had a TCS before) he can do so. We can work him in with Dr. Jena Gauss for other thoughts when there's an opening.

## 2016-05-08 ENCOUNTER — Ambulatory Visit (HOSPITAL_COMMUNITY): Admission: RE | Admit: 2016-05-08 | Payer: 59 | Source: Ambulatory Visit | Admitting: Internal Medicine

## 2016-05-08 ENCOUNTER — Encounter (HOSPITAL_COMMUNITY): Admission: RE | Payer: Self-pay | Source: Ambulatory Visit

## 2016-05-08 SURGERY — COLONOSCOPY
Anesthesia: Moderate Sedation

## 2016-05-15 ENCOUNTER — Telehealth: Payer: Self-pay | Admitting: Nurse Practitioner

## 2016-05-15 NOTE — Telephone Encounter (Signed)
Received STD update request from disability company. Per previous communications between our office and the patient, we will not sign paperwork for short term disability because of "rectal bleeding." Of note, at his last OV he noted no rectal bleeding in the week leading up to his OV. Also, he has cancelled his TCS. Further recommendations to be made after the patient sees Dr. Jena Gauss for follow-up

## 2016-06-10 ENCOUNTER — Ambulatory Visit: Payer: 59 | Admitting: Internal Medicine

## 2016-06-12 ENCOUNTER — Other Ambulatory Visit (HOSPITAL_COMMUNITY): Payer: Self-pay | Admitting: Family Medicine

## 2016-06-12 DIAGNOSIS — N281 Cyst of kidney, acquired: Secondary | ICD-10-CM

## 2016-06-16 ENCOUNTER — Ambulatory Visit (HOSPITAL_COMMUNITY)
Admission: RE | Admit: 2016-06-16 | Discharge: 2016-06-16 | Disposition: A | Discharge status: Home / Self Care | Payer: 59 | Source: Ambulatory Visit | Attending: Family Medicine | Admitting: Family Medicine

## 2016-06-16 DIAGNOSIS — N281 Cyst of kidney, acquired: Secondary | ICD-10-CM | POA: Insufficient documentation

## 2016-06-26 ENCOUNTER — Other Ambulatory Visit (HOSPITAL_COMMUNITY): Payer: Self-pay | Admitting: Family Medicine

## 2016-06-26 DIAGNOSIS — R93421 Abnormal radiologic findings on diagnostic imaging of right kidney: Secondary | ICD-10-CM

## 2016-06-30 ENCOUNTER — Ambulatory Visit (HOSPITAL_COMMUNITY): Payer: 59

## 2016-07-01 ENCOUNTER — Ambulatory Visit (HOSPITAL_COMMUNITY)
Admission: RE | Admit: 2016-07-01 | Discharge: 2016-07-01 | Disposition: A | Discharge status: Home / Self Care | Payer: 59 | Source: Ambulatory Visit | Attending: Family Medicine | Admitting: Family Medicine

## 2016-07-01 ENCOUNTER — Ambulatory Visit: Payer: 59 | Admitting: Nurse Practitioner

## 2016-07-01 DIAGNOSIS — R93421 Abnormal radiologic findings on diagnostic imaging of right kidney: Secondary | ICD-10-CM | POA: Diagnosis not present

## 2016-07-01 DIAGNOSIS — N281 Cyst of kidney, acquired: Secondary | ICD-10-CM | POA: Diagnosis not present

## 2016-07-01 MED ORDER — GADOBENATE DIMEGLUMINE 529 MG/ML IV SOLN
14.0000 mL | Freq: Once | INTRAVENOUS | Status: AC | PRN
  Administered 2016-07-01: 14 mL via INTRAVENOUS

## 2016-07-01 MED ORDER — SODIUM CHLORIDE 0.9 % IV SOLN
INTRAVENOUS | Status: AC
  Filled 2016-07-01: qty 50

## 2016-07-15 ENCOUNTER — Other Ambulatory Visit: Payer: Self-pay | Admitting: Internal Medicine

## 2016-07-15 DIAGNOSIS — R5381 Other malaise: Secondary | ICD-10-CM

## 2016-07-15 DIAGNOSIS — R5383 Other fatigue: Secondary | ICD-10-CM

## 2016-07-15 DIAGNOSIS — R42 Dizziness and giddiness: Secondary | ICD-10-CM

## 2016-07-21 ENCOUNTER — Ambulatory Visit
Admission: RE | Admit: 2016-07-21 | Discharge: 2016-07-21 | Disposition: A | Discharge status: Home / Self Care | Payer: 59 | Source: Ambulatory Visit | Attending: Internal Medicine | Admitting: Internal Medicine

## 2016-07-21 DIAGNOSIS — R5383 Other fatigue: Secondary | ICD-10-CM | POA: Insufficient documentation

## 2016-07-21 DIAGNOSIS — R42 Dizziness and giddiness: Secondary | ICD-10-CM | POA: Insufficient documentation

## 2016-07-21 DIAGNOSIS — J322 Chronic ethmoidal sinusitis: Secondary | ICD-10-CM | POA: Diagnosis not present

## 2016-07-21 DIAGNOSIS — R5381 Other malaise: Secondary | ICD-10-CM | POA: Insufficient documentation

## 2016-08-18 ENCOUNTER — Telehealth: Payer: Self-pay | Admitting: Nurse Practitioner

## 2016-08-18 ENCOUNTER — Ambulatory Visit: Payer: 59 | Admitting: Nurse Practitioner

## 2016-08-18 ENCOUNTER — Encounter: Payer: Self-pay | Admitting: Nurse Practitioner

## 2016-08-18 NOTE — Telephone Encounter (Signed)
Noted  

## 2016-08-18 NOTE — Telephone Encounter (Signed)
PATIENT WAS A NO SHOW AND LETTER SENT  °

## 2016-08-19 ENCOUNTER — Emergency Department
Admission: EM | Admit: 2016-08-19 | Discharge: 2016-08-19 | Disposition: A | Discharge status: Home / Self Care | Payer: 59 | Attending: Student in an Organized Health Care Education/Training Program | Admitting: Student in an Organized Health Care Education/Training Program

## 2016-08-19 ENCOUNTER — Emergency Department: Payer: 59

## 2016-08-19 ENCOUNTER — Encounter: Payer: Self-pay | Admitting: Emergency Medicine

## 2016-08-19 DIAGNOSIS — R1310 Dysphagia, unspecified: Secondary | ICD-10-CM

## 2016-08-19 DIAGNOSIS — F17210 Nicotine dependence, cigarettes, uncomplicated: Secondary | ICD-10-CM | POA: Insufficient documentation

## 2016-08-19 DIAGNOSIS — J029 Acute pharyngitis, unspecified: Secondary | ICD-10-CM | POA: Insufficient documentation

## 2016-08-19 DIAGNOSIS — J45909 Unspecified asthma, uncomplicated: Secondary | ICD-10-CM | POA: Insufficient documentation

## 2016-08-19 DIAGNOSIS — Z79899 Other long term (current) drug therapy: Secondary | ICD-10-CM | POA: Insufficient documentation

## 2016-08-19 LAB — CBC WITH DIFFERENTIAL/PLATELET
BASOS PCT: 1 %
Basophils Absolute: 0 10*3/uL (ref 0–0.1)
Eosinophils Absolute: 0.1 10*3/uL (ref 0–0.7)
Eosinophils Relative: 1 %
HEMATOCRIT: 44.5 % (ref 40.0–52.0)
HEMOGLOBIN: 15.5 g/dL (ref 13.0–18.0)
LYMPHS PCT: 19 %
Lymphs Abs: 1.1 10*3/uL (ref 1.0–3.6)
MCH: 30 pg (ref 26.0–34.0)
MCHC: 34.9 g/dL (ref 32.0–36.0)
MCV: 86 fL (ref 80.0–100.0)
MONOS PCT: 6 %
Monocytes Absolute: 0.4 10*3/uL (ref 0.2–1.0)
NEUTROS ABS: 4.3 10*3/uL (ref 1.4–6.5)
NEUTROS PCT: 73 %
Platelets: 211 10*3/uL (ref 150–440)
RBC: 5.18 MIL/uL (ref 4.40–5.90)
RDW: 12.8 % (ref 11.5–14.5)
WBC: 5.8 10*3/uL (ref 3.8–10.6)

## 2016-08-19 LAB — COMPREHENSIVE METABOLIC PANEL
ALBUMIN: 4.7 g/dL (ref 3.5–5.0)
ALK PHOS: 71 U/L (ref 38–126)
ALT: 38 U/L (ref 17–63)
ANION GAP: 7 (ref 5–15)
AST: 28 U/L (ref 15–41)
BILIRUBIN TOTAL: 0.8 mg/dL (ref 0.3–1.2)
BUN: 8 mg/dL (ref 6–20)
CALCIUM: 9.4 mg/dL (ref 8.9–10.3)
CO2: 28 mmol/L (ref 22–32)
Chloride: 102 mmol/L (ref 101–111)
Creatinine, Ser: 0.92 mg/dL (ref 0.61–1.24)
GFR calc Af Amer: >60 mL/min (ref >60)
GFR calc non Af Amer: >60 mL/min (ref >60)
GLUCOSE: 108 mg/dL — AB (ref 65–99)
Potassium: 3.7 mmol/L (ref 3.5–5.1)
Sodium: 137 mmol/L (ref 135–145)
TOTAL PROTEIN: 8.1 g/dL (ref 6.5–8.1)

## 2016-08-19 MED ORDER — CLINDAMYCIN HCL 150 MG PO CAPS
300.0000 mg | ORAL_CAPSULE | Freq: Once | ORAL | Status: AC
  Administered 2016-08-19: 300 mg via ORAL
  Filled 2016-08-19: qty 2

## 2016-08-19 MED ORDER — CLINDAMYCIN HCL 300 MG PO CAPS: 300.0000 mg | ORAL_CAPSULE | Freq: Three times a day (TID) | ORAL | 0 refills | Status: AC

## 2016-08-19 MED ORDER — SODIUM CHLORIDE 0.9 % IV BOLUS (SEPSIS)
1000.0000 mL | Freq: Once | INTRAVENOUS | Status: AC
  Administered 2016-08-19: 1000 mL via INTRAVENOUS

## 2016-08-19 MED ORDER — DEXAMETHASONE SODIUM PHOSPHATE 10 MG/ML IJ SOLN
10.0000 mg | Freq: Once | INTRAMUSCULAR | Status: AC
  Administered 2016-08-19: 10 mg via INTRAVENOUS
  Filled 2016-08-19: qty 1

## 2016-08-19 MED ORDER — LORAZEPAM 2 MG/ML IJ SOLN
1.0000 mg | Freq: Once | INTRAMUSCULAR | Status: AC
  Administered 2016-08-19: 1 mg via INTRAVENOUS
  Filled 2016-08-19: qty 1

## 2016-08-19 NOTE — ED Triage Notes (Signed)
p to ed with c/o swelling to throat area, and difficulty swallowing. Pt reports this has been progressing over the past 3 days, reports much worse today,  Pt does not appear to be in resp distress, however, pt states he feels like he is choking at this time.

## 2016-08-19 NOTE — ED Provider Notes (Signed)
[facility] Emergency Department Provider Note    First MD Initiated Contact with Patient 08/19/16 1357     (approximate)  I have reviewed the triage vital signs and the nursing notes.   HISTORY  Chief Complaint Dysphagia    HPI Paul Mcdaniel is a 56 y.o. male who presents with chief complaint of dysphagia and feeling that his throat was closing up. Patient was recently started on Augmentin for sinus infection. States he's been taking these antibiotics off and on for the past week. States he developed sore throat over the past few days they last. Today she had a worse allergic pain in his throat was swollen. Patient is able to tolerate oral hydration. He is tolerating his secretions. No vomiting. No measured fevers. No numbness or tingling. No shortness of breath. Also states that over the past day or 2 he's been having lightheadedness and felt that he is about to faint after standing up. Does admit to decreased oral hydration and decreased appetite.  No chest pain or pressure.   Past Medical History:  Diagnosis Date  . Asthma   . Pneumonia 01/2016  . Pneumothorax    Family History  Problem Relation Age of Onset  . Colon cancer Neg Hx    Past Surgical History:  Procedure Laterality Date  . NONE TO DATE     Patient Active Problem List   Diagnosis Date Noted  . Rectal bleeding 04/28/2016  . Abdominal pain 04/28/2016  . Nausea without vomiting 04/28/2016  . Sebaceous cyst 05/23/2013      Prior to Admission medications   Medication Sig Start Date End Date Taking? Authorizing Provider  omeprazole (PRILOSEC) 20 MG capsule Take 1 capsule (20 mg total) by mouth daily. 04/28/16   Anice Paganini, NP  promethazine (PHENERGAN) 25 MG tablet Take 1 tablet (25 mg total) by mouth as needed for nausea or vomiting. 04/28/16   Anice Paganini, NP    Allergies Ibuprofen    Social History Social History  Substance Use Topics  . Smoking status: Current Some Day  Smoker    Packs/day: 0.50    Types: Cigarettes  . Smokeless tobacco: Never Used  . Alcohol use No     Comment: Quit in 2015; Previously weekend/social drinker.    Review of Systems Patient denies headaches, rhinorrhea, blurry vision, numbness, shortness of breath, chest pain, edema, cough, abdominal pain, nausea, vomiting, diarrhea, dysuria, fevers, rashes or hallucinations unless otherwise stated above in HPI. ____________________________________________   PHYSICAL EXAM:  VITAL SIGNS: Vitals:   08/19/16 1430 08/19/16 1503  BP: 138/89 121/87  Pulse: 90 81  Resp: 17 16  Temp:      Constitutional: Alert and oriented. Well appearing and in no acute distress. Eyes: Conjunctivae are normal.  Head: Atraumatic. Nose: No congestion/rhinnorhea. Mouth/Throat: Mucous membranes are moist.  Posterior pharynx is erythematous, uvula is midline, no tonsillar swelling or edema Neck: No stridor. Painless ROM. No lymphadenopathy, no sublingual firmness or erythema Cardiovascular: Normal rate, regular rhythm. Grossly normal heart sounds.  Good peripheral circulation. Respiratory: Normal respiratory effort.  No retractions. Lungs CTAB. Gastrointestinal: Soft and nontender. No distention. No abdominal bruits. No CVA tenderness. Musculoskeletal: No lower extremity tenderness nor edema.  No joint effusions. Neurologic:  Normal speech and language. No gross focal neurologic deficits are appreciated. No facial droop Skin:  Skin is warm, dry and intact. No rash noted. Psychiatric: Mood and affect are normal. Speech and behavior are normal.  ____________________________________________  LABS (all labs ordered are listed, but only abnormal results are displayed)  Results for orders placed or performed during the hospital encounter of 08/19/16 (from the past 24 hour(s))  CBC with Differential/Platelet     Status: None   Collection Time: 08/19/16  1:57 PM  Result Value Ref Range   WBC 5.8 3.8 - 10.6  K/uL   RBC 5.18 4.40 - 5.90 MIL/uL   Hemoglobin 15.5 13.0 - 18.0 g/dL   HCT 16.1 09.6 - 04.5 %   MCV 86.0 80.0 - 100.0 fL   MCH 30.0 26.0 - 34.0 pg   MCHC 34.9 32.0 - 36.0 g/dL   RDW 40.9 81.1 - 91.4 %   Platelets 211 150 - 440 K/uL   Neutrophils Relative % 73 %   Neutro Abs 4.3 1.4 - 6.5 K/uL   Lymphocytes Relative 19 %   Lymphs Abs 1.1 1.0 - 3.6 K/uL   Monocytes Relative 6 %   Monocytes Absolute 0.4 0.2 - 1.0 K/uL   Eosinophils Relative 1 %   Eosinophils Absolute 0.1 0 - 0.7 K/uL   Basophils Relative 1 %   Basophils Absolute 0.0 0 - 0.1 K/uL  Comprehensive metabolic panel     Status: Abnormal   Collection Time: 08/19/16  1:57 PM  Result Value Ref Range   Sodium 137 135 - 145 mmol/L   Potassium 3.7 3.5 - 5.1 mmol/L   Chloride 102 101 - 111 mmol/L   CO2 28 22 - 32 mmol/L   Glucose, Bld 108 (H) 65 - 99 mg/dL   BUN 8 6 - 20 mg/dL   Creatinine, Ser 7.82 0.61 - 1.24 mg/dL   Calcium 9.4 8.9 - 95.6 mg/dL   Total Protein 8.1 6.5 - 8.1 g/dL   Albumin 4.7 3.5 - 5.0 g/dL   AST 28 15 - 41 U/L   ALT 38 17 - 63 U/L   Alkaline Phosphatase 71 38 - 126 U/L   Total Bilirubin 0.8 0.3 - 1.2 mg/dL   GFR calc non Af Amer >60 >60 mL/min   GFR calc Af Amer >60 >60 mL/min   Anion gap 7 5 - 15   ____________________________________________ ____________________________________________  RADIOLOGY  I personally reviewed all radiographic images ordered to evaluate for the above acute complaints and reviewed radiology reports and findings.  These findings were personally discussed with the patient.  Please see medical record for radiology report.  ____________________________________________   PROCEDURES  Procedure(s) performed:  Procedures    Critical Care performed: no ____________________________________________   INITIAL IMPRESSION / ASSESSMENT AND PLAN / ED COURSE  Pertinent labs & imaging results that were available during my care of the patient were reviewed by me and  considered in my medical decision making (see chart for details).  DDX: pharyngitis, mass, obstruction, abscess  Paul Mcdaniel is a 56 y.o. who presents to the ED with Chief complaint of dysphagia and feeling that his throat is closing up since last night. Patient afebrile and hemodynamic stable. Does have some mild pharyngeal prominence but no uvular edema. Uvula is midline. Based on his sensation of dysphagia and difficulty breathing CT imaging ordered to evaluate for mass or obstructive process. CT is reassuring. Clinical hesitation are consistent with left legs are PA or PTA. Patient without any stridor and is tolerating oral hydration. Patient does admit to decreased oral oral hydration over the past several days and lightheadedness with standing therefore we'll give IV bolus for component of dehydration. As the patient has been  intermittently compliant with his Augmentin I will switch antibiotics to clindamycin. Patient otherwise stable and appropriate for further outpatient management.  Have discussed with the patient and available family all diagnostics and treatments performed thus far and all questions were answered to the best of my ability. The patient demonstrates understanding and agreement with plan.       ____________________________________________   FINAL CLINICAL IMPRESSION(S) / ED DIAGNOSES  Final diagnoses:  Pharyngitis, unspecified etiology      NEW MEDICATIONS STARTED DURING THIS VISIT:  New Prescriptions   No medications on file     Note:  This document was prepared using Dragon voice recognition software and may include unintentional dictation errors.    Willy Eddyobinson, Amri Lien, MD 08/19/16 [phone]

## 2016-08-19 NOTE — ED Notes (Signed)
Pt states that he feels better at this time, states that throat still feels a little scratchy, but that he feels like he is ready to go home.  Dr. Lenard LancePaduchowski notified.

## 2016-08-19 NOTE — Discharge Instructions (Signed)
Return for worsening pain, feelings of shortness of breath or trouble swallowing.

## 2016-08-19 NOTE — ED Notes (Signed)
MD to bedside at this time 

## 2016-08-19 NOTE — ED Notes (Signed)
Pt returned from CT at this time.  

## 2016-08-19 NOTE — ED Notes (Signed)
Pt able to tolerate swallowing PO medications without difficulty at this time.

## 2016-08-19 NOTE — ED Notes (Signed)
Pt taken to CT at this time.

## 2016-08-21 ENCOUNTER — Telehealth: Payer: Self-pay | Admitting: Emergency Medicine

## 2016-08-21 NOTE — Telephone Encounter (Signed)
Pt called and states he has taken one day of clindamycin (yesterday) and has been itching today and itching inside.  Says it is not bad.    I asked him not to take any more of the clindamycin, and I will call him back this afternoon with instructions after I speak with dr Roxan Hockeyrobinson.  He asked about benadryl and it told him it would be fine to take benadryl for the itching.  I asked him about allergies and he said he has been able to take amoxil in the past without problem.

## 2016-09-23 ENCOUNTER — Encounter: Payer: Self-pay | Admitting: Emergency Medicine

## 2016-09-23 ENCOUNTER — Emergency Department
Admission: EM | Admit: 2016-09-23 | Discharge: 2016-09-23 | Disposition: A | Discharge status: Home / Self Care | Payer: 59 | Attending: Emergency Medicine | Admitting: Emergency Medicine

## 2016-09-23 DIAGNOSIS — Z79899 Other long term (current) drug therapy: Secondary | ICD-10-CM | POA: Insufficient documentation

## 2016-09-23 DIAGNOSIS — L723 Sebaceous cyst: Secondary | ICD-10-CM | POA: Insufficient documentation

## 2016-09-23 DIAGNOSIS — L089 Local infection of the skin and subcutaneous tissue, unspecified: Secondary | ICD-10-CM

## 2016-09-23 DIAGNOSIS — F17210 Nicotine dependence, cigarettes, uncomplicated: Secondary | ICD-10-CM | POA: Insufficient documentation

## 2016-09-23 DIAGNOSIS — J45909 Unspecified asthma, uncomplicated: Secondary | ICD-10-CM | POA: Insufficient documentation

## 2016-09-23 DIAGNOSIS — E039 Hypothyroidism, unspecified: Secondary | ICD-10-CM

## 2016-09-23 LAB — BASIC METABOLIC PANEL
ANION GAP: 8 (ref 5–15)
BUN: 9 mg/dL (ref 6–20)
CALCIUM: 9.6 mg/dL (ref 8.9–10.3)
CHLORIDE: 104 mmol/L (ref 101–111)
CO2: 28 mmol/L (ref 22–32)
CREATININE: 0.9 mg/dL (ref 0.61–1.24)
GFR calc non Af Amer: >60 mL/min (ref >60)
Glucose, Bld: 108 mg/dL — ABNORMAL HIGH (ref 65–99)
Potassium: 4.5 mmol/L (ref 3.5–5.1)
SODIUM: 140 mmol/L (ref 135–145)

## 2016-09-23 LAB — CBC WITH DIFFERENTIAL/PLATELET
BASOS PCT: 1 %
Basophils Absolute: 0 10*3/uL (ref 0–0.1)
EOS ABS: 0.1 10*3/uL (ref 0–0.7)
Eosinophils Relative: 1 %
HCT: 44.5 % (ref 40.0–52.0)
Hemoglobin: 15.7 g/dL (ref 13.0–18.0)
LYMPHS ABS: 1.1 10*3/uL (ref 1.0–3.6)
Lymphocytes Relative: 16 %
MCH: 30.9 pg (ref 26.0–34.0)
MCHC: 35.2 g/dL (ref 32.0–36.0)
MCV: 87.7 fL (ref 80.0–100.0)
MONOS PCT: 6 %
Monocytes Absolute: 0.4 10*3/uL (ref 0.2–1.0)
NEUTROS PCT: 76 %
Neutro Abs: 5.2 10*3/uL (ref 1.4–6.5)
Platelets: 229 10*3/uL (ref 150–440)
RBC: 5.08 MIL/uL (ref 4.40–5.90)
RDW: 13.6 % (ref 11.5–14.5)
WBC: 6.8 10*3/uL (ref 3.8–10.6)

## 2016-09-23 LAB — TSH: TSH: 3.163 u[IU]/mL (ref 0.350–4.500)

## 2016-09-23 MED ORDER — LIDOCAINE HCL (PF) 1 % IJ SOLN
5.0000 mL | Freq: Once | INTRAMUSCULAR | Status: AC
  Administered 2016-09-23: 5 mL
  Filled 2016-09-23: qty 5

## 2016-09-23 MED ORDER — DOXYCYCLINE HYCLATE 100 MG PO TABS
100.0000 mg | ORAL_TABLET | Freq: Once | ORAL | Status: AC
  Administered 2016-09-23: 100 mg via ORAL
  Filled 2016-09-23: qty 1

## 2016-09-23 MED ORDER — DOXYCYCLINE HYCLATE 100 MG PO TABS: 100.0000 mg | ORAL_TABLET | Freq: Two times a day (BID) | ORAL | 0 refills | Status: AC

## 2016-09-23 MED ORDER — LEVOTHYROXINE SODIUM 50 MCG PO TABS: 50.0000 ug | ORAL_TABLET | Freq: Every day | ORAL | 0 refills | Status: AC
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
## 2016-09-23 NOTE — ED Notes (Signed)
Wound covered by this RN.

## 2016-09-23 NOTE — ED Notes (Signed)
See triage note  Noticed a large red raised area to mid back about 2 weeks ago

## 2016-09-23 NOTE — Discharge Instructions (Signed)
Your labs have been reviewed, you have a lowered TSH (thyroid stimulating hormone). You value went from normal 2.460 uUI/dl at [facility] on 1/61/09 to abnormal 5.131 uIU/dl on 06/30/52 at [facility]. You should start the hormone replacement therapy as soon as possible. You should follow-up with [facility] next week as scheduled. Take the antibiotic for your infected sebaceous cyst as directed. Remove the packing in 3 day. Apply warm compresses to promote healing. Return to the ED as needed.
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
## 2016-09-25 ENCOUNTER — Telehealth: Payer: Self-pay | Admitting: Emergency Medicine

## 2016-09-25 NOTE — Telephone Encounter (Addendum)
Pt called to say he is having itching since taking the doxycycline.  Discussed with Dr. Alphonzo LemmingsMcshane and can change patient to keflex 500 mg 3 times a day for 5 days.  Called in to medication management.

## 2016-09-30 ENCOUNTER — Ambulatory Visit: Payer: Self-pay

## 2016-09-30 ENCOUNTER — Telehealth: Payer: Self-pay | Admitting: Nurse Practitioner

## 2016-09-30 NOTE — Telephone Encounter (Deleted)
Rescheduled appt to 9/11

## 2016-10-07 ENCOUNTER — Ambulatory Visit: Payer: Self-pay

## 2016-10-07 ENCOUNTER — Telehealth: Payer: Self-pay | Admitting: Pharmacy Technician

## 2016-10-07 NOTE — Telephone Encounter (Signed)
Cancelled appt for tonight. Call and reschedule

## 2016-10-07 NOTE — Telephone Encounter (Signed)
Patient eligible to receive medication assistance at [facility] through 2018, as long as eligibility requirements continue to be met.  Eldred Sooy J. Earmon Sherrow Care Manager [facility] 

## 2016-10-09 ENCOUNTER — Telehealth: Payer: Self-pay

## 2016-10-09 NOTE — Telephone Encounter (Signed)
Called pt back and rescheduled appt. PT stated he has a growth on shoulder that is red and giving him cold sweats I advised pt to go to the ED if he felt he needed to be seen before his appt on the 27th.  Pt verbalized understanding.

## 2016-10-23 ENCOUNTER — Ambulatory Visit: Payer: Self-pay

## 2016-10-30 ENCOUNTER — Ambulatory Visit: Payer: Self-pay

## 2016-11-13 ENCOUNTER — Ambulatory Visit: Payer: Self-pay

## 2016-11-19 ENCOUNTER — Ambulatory Visit: Payer: Self-pay | Admitting: Unknown Physician Specialty

## 2016-11-25 ENCOUNTER — Telehealth: Payer: Self-pay | Admitting: Adult Health Nurse Practitioner

## 2016-11-25 NOTE — Telephone Encounter (Signed)
Wanted to know when appt was. Called back

## 2016-11-27 ENCOUNTER — Ambulatory Visit: Payer: Self-pay

## 2016-12-09 ENCOUNTER — Ambulatory Visit: Payer: Self-pay

## 2016-12-09 ENCOUNTER — Ambulatory Visit: Payer: Self-pay | Admitting: Unknown Physician Specialty

## 2016-12-23 ENCOUNTER — Ambulatory Visit: Payer: Self-pay

## 2017-01-08 ENCOUNTER — Ambulatory Visit: Payer: Self-pay

## 2017-01-22 ENCOUNTER — Ambulatory Visit: Payer: Self-pay

## 2017-02-03 ENCOUNTER — Ambulatory Visit: Payer: Self-pay

## 2017-02-10 ENCOUNTER — Ambulatory Visit: Payer: Self-pay

## 2017-02-11 ENCOUNTER — Ambulatory Visit: Payer: Medicaid Other | Admitting: Internal Medicine

## 2017-02-19 ENCOUNTER — Ambulatory Visit: Payer: Medicaid Other

## 2017-02-24 ENCOUNTER — Ambulatory Visit: Payer: Medicaid Other | Admitting: Family Medicine

## 2017-03-03 ENCOUNTER — Ambulatory Visit: Payer: Medicaid Other

## 2017-03-12 ENCOUNTER — Ambulatory Visit: Payer: Medicaid Other

## 2017-03-26 ENCOUNTER — Ambulatory Visit: Payer: Medicaid Other

## 2017-04-02 ENCOUNTER — Ambulatory Visit: Payer: Medicaid Other | Admitting: Adult Health

## 2017-04-16 ENCOUNTER — Ambulatory Visit: Payer: Medicaid Other | Admitting: Adult Health

## 2017-04-30 ENCOUNTER — Ambulatory Visit: Payer: Medicaid Other | Admitting: Adult Health

## 2017-05-07 ENCOUNTER — Ambulatory Visit: Payer: Medicaid Other | Admitting: Adult Health

## 2017-05-14 ENCOUNTER — Ambulatory Visit: Payer: Medicaid Other | Admitting: Adult Health

## 2017-05-28 ENCOUNTER — Ambulatory Visit: Payer: Medicaid Other

## 2017-06-18 ENCOUNTER — Ambulatory Visit: Payer: Medicaid Other | Admitting: Adult Health

## 2017-06-25 ENCOUNTER — Ambulatory Visit: Payer: Medicaid Other

## 2017-07-02 ENCOUNTER — Ambulatory Visit: Payer: Medicaid Other

## 2017-07-08 ENCOUNTER — Ambulatory Visit: Payer: Medicaid Other | Admitting: Internal Medicine

## 2017-07-22 ENCOUNTER — Ambulatory Visit: Payer: Medicaid Other | Admitting: Internal Medicine

## 2017-09-03 ENCOUNTER — Ambulatory Visit: Payer: Medicaid Other

## 2017-09-10 ENCOUNTER — Ambulatory Visit: Payer: Medicaid Other

## 2017-09-29 ENCOUNTER — Ambulatory Visit: Payer: Medicaid Other

## 2017-11-19 ENCOUNTER — Telehealth: Payer: Self-pay | Admitting: Pharmacy Technician

## 2017-11-19 NOTE — Telephone Encounter (Signed)
Patient failed to provide 2019 poi.  No additional medication assistance will be provided by [facility] without the required proof of income documentation.  Patient notified by letter.  Dalana Pfahler J. Dayquan Buys Care Manager [facility] 

## 2018-06-14 ENCOUNTER — Emergency Department
Admission: EM | Admit: 2018-06-14 | Discharge: 2018-06-14 | Disposition: A | Discharge status: Home / Self Care | Payer: Self-pay | Attending: Emergency Medicine | Admitting: Emergency Medicine

## 2018-06-14 ENCOUNTER — Other Ambulatory Visit: Payer: Self-pay

## 2018-06-14 DIAGNOSIS — L0291 Cutaneous abscess, unspecified: Secondary | ICD-10-CM

## 2018-06-14 DIAGNOSIS — F17210 Nicotine dependence, cigarettes, uncomplicated: Secondary | ICD-10-CM | POA: Insufficient documentation

## 2018-06-14 DIAGNOSIS — L02213 Cutaneous abscess of chest wall: Secondary | ICD-10-CM | POA: Insufficient documentation

## 2018-06-14 MED ORDER — HYDROCODONE-ACETAMINOPHEN 5-325 MG PO TABS: 1.0000 | ORAL_TABLET | Freq: Four times a day (QID) | ORAL | 0 refills | Status: AC | PRN

## 2018-06-14 MED ORDER — LIDOCAINE HCL (PF) 1 % IJ SOLN
5.0000 mL | Freq: Once | INTRAMUSCULAR | Status: AC
  Administered 2018-06-14: 5 mL via INTRADERMAL
  Filled 2018-06-14: qty 5

## 2018-06-14 MED ORDER — AZITHROMYCIN 250 MG PO TABS: ORAL_TABLET | ORAL | 0 refills | Status: AC

## 2018-06-14 NOTE — ED Notes (Signed)
See triage note    Presents with swelling to left arm/shoulder  Hx of same  Denies any injury  But having increased pain with movement

## 2018-06-14 NOTE — ED Provider Notes (Signed)
[facility] Emergency Department Provider Note  ____________________________________________   First MD Initiated Contact with Patient 06/14/18 [phone]     (approximate)  I have reviewed the triage vital signs and the nursing notes.   HISTORY  Chief Complaint Abscess    HPI Paul Mcdaniel is a 58 y.o. male presents emergency department complaining of an abscess to the left side of his chest.  History of similar abscesses on other areas of his body.  No fever or chills.  States left shoulder is been hurting due to the mild swelling.      Past Medical History:  Diagnosis Date  . Asthma   . Pneumonia 01/2016  . Pneumothorax     Patient Active Problem List   Diagnosis Date Noted  . Rectal bleeding 04/28/2016  . Abdominal pain 04/28/2016  . Nausea without vomiting 04/28/2016  . Sebaceous cyst 05/23/2013    Past Surgical History:  Procedure Laterality Date  . NONE TO DATE      Prior to Admission medications   Medication Sig Start Date End Date Taking? Authorizing Provider  azithromycin (ZITHROMAX Z-PAK) 250 MG tablet 2 pills today then 1 pill a day for 4 days 06/14/18   Sherrie MustacheFisher, Roselyn BeringSusan W, PA-C  doxycycline (VIBRA-TABS) 100 MG tablet Take 1 tablet (100 mg total) by mouth 2 (two) times daily. 09/23/16   Menshew, Charlesetta IvoryJenise V Bacon, PA-C  HYDROcodone-acetaminophen (NORCO/VICODIN) 5-325 MG tablet Take 1 tablet by mouth every 6 (six) hours as needed for moderate pain. 06/14/18   Sherrie MustacheFisher, Roselyn BeringSusan W, PA-C  levothyroxine (SYNTHROID) 50 MCG tablet Take 1 tablet (50 mcg total) by mouth daily before breakfast. 09/23/16 10/23/16  Menshew, Charlesetta IvoryJenise V Bacon, PA-C  omeprazole (PRILOSEC) 20 MG capsule Take 1 capsule (20 mg total) by mouth daily. 04/28/16   Anice PaganiniGill, Eric A, NP  promethazine (PHENERGAN) 25 MG tablet Take 1 tablet (25 mg total) by mouth as needed for nausea or vomiting. 04/28/16   Anice PaganiniGill, Eric A, NP    Allergies Ibuprofen; Clindamycin/lincomycin; Doxycycline; Penicillins; and  Sulfa antibiotics  Family History  Problem Relation Age of Onset  . Colon cancer Neg Hx     Social History Social History   Tobacco Use  . Smoking status: Current Some Day Smoker    Packs/day: 0.50    Types: Cigarettes  . Smokeless tobacco: Never Used  Substance Use Topics  . Alcohol use: No    Comment: Quit in 2015; Previously weekend/social drinker.  . Drug use: No    Review of Systems  Constitutional: No fever/chills Eyes: No visual changes. ENT: No sore throat. Respiratory: Denies cough Genitourinary: Negative for dysuria. Musculoskeletal: Negative for back pain. Skin: Negative for rash.  Positive abscess    ____________________________________________   PHYSICAL EXAM:  VITAL SIGNS: ED Triage Vitals  Enc Vitals Group     BP 06/14/18 0917 (!) 151/85     Pulse Rate 06/14/18 0917 (!) 112     Resp 06/14/18 0917 16     Temp 06/14/18 0917 98.5 F (36.9 C)     Temp Source 06/14/18 0917 Oral     SpO2 06/14/18 0917 97 %     Weight 06/14/18 0914 200 lb (90.7 kg)     Height 06/14/18 0914 5\' 11"  (1.803 m)     Head Circumference --      Peak Flow --      Pain Score 06/14/18 0914 8     Pain Loc --      Pain  Edu? --      Excl. in GC? --     Constitutional: Alert and oriented. Well appearing and in no acute distress. Eyes: Conjunctivae are normal.  Head: Atraumatic. Nose: No congestion/rhinnorhea. Mouth/Throat: Mucous membranes are moist.   Neck:  supple no lymphadenopathy noted Cardiovascular: Normal rate, regular rhythm. Heart sounds are normal Respiratory: Normal respiratory effort.  No retractions, lungs c t a  GU: deferred Musculoskeletal: FROM all extremities, warm and well perfused Neurologic:  Normal speech and language.  Skin:  Skin is warm, dry and intact. No rash noted.  Positive for a $0.50 sized abscess to the left shoulder Psychiatric: Mood and affect are normal. Speech and behavior are normal.  ____________________________________________    LABS (all labs ordered are listed, but only abnormal results are displayed)  Labs Reviewed - No data to display ____________________________________________   ____________________________________________  RADIOLOGY    ____________________________________________   PROCEDURES  Procedure(s) performed:   Marland KitchenMarland KitchenIncision and Drainage Date/Time: 06/14/2018 10:04 AM Performed by: Faythe Ghee, PA-C Authorized by: Faythe Ghee, PA-C   Consent:    Consent obtained:  Verbal   Consent given by:  Patient   Risks discussed:  Bleeding, incomplete drainage, pain, damage to other organs and infection   Alternatives discussed:  Referral Location:    Type:  Abscess   Location:  Trunk   Trunk location:  Chest Pre-procedure details:    Skin preparation:  Betadine Anesthesia (see MAR for exact dosages):    Anesthesia method:  Local infiltration   Local anesthetic:  Lidocaine 1% w/o epi Procedure type:    Complexity:  Complex Procedure details:    Needle aspiration: no     Incision types:  Stab incision   Incision depth:  Dermal   Scalpel blade:  11   Wound management:  Probed and deloculated and irrigated with saline   Drainage:  Bloody and purulent   Drainage amount:  Moderate   Wound treatment:  Drain placed   Packing materials:  1/4 in iodoform gauze   Amount 1/4" iodoform:  6 inches Post-procedure details:    Patient tolerance of procedure:  Tolerated well, no immediate complications      ____________________________________________   INITIAL IMPRESSION / ASSESSMENT AND PLAN / ED COURSE  Pertinent labs & imaging results that were available during my care of the patient were reviewed by me and considered in my medical decision making (see chart for details).   Patient is 58 year old male presents emergency department abscess to the left shoulder  Physical exam shows the abscess to be located at the anterior portion of the left shoulder.  Swelling is noted.   Tenderness is noted.  Procedure note for I&D  Patient tolerated procedure well.  Area was packed.  He is to return in 2 days for packing removal.  Due to his multiple allergies he was given a Z-Pak as patient states he is able to take this antibiotic without difficulty.  Was given a prescription for Vicodin for pain.  He is to stop either medication if he develops an allergic reaction.  He states he understands will comply.  Was discharged in stable condition.     As part of my medical decision making, I reviewed the following data within the electronic MEDICAL RECORD NUMBER Nursing notes reviewed and incorporated, Old chart reviewed, Notes from prior ED visits and [place] Controlled Substance Database  ____________________________________________   FINAL CLINICAL IMPRESSION(S) / ED DIAGNOSES  Final diagnoses:  Abscess  NEW MEDICATIONS STARTED DURING THIS VISIT:  New Prescriptions   AZITHROMYCIN (ZITHROMAX Z-PAK) 250 MG TABLET    2 pills today then 1 pill a day for 4 days   HYDROCODONE-ACETAMINOPHEN (NORCO/VICODIN) 5-325 MG TABLET    Take 1 tablet by mouth every 6 (six) hours as needed for moderate pain.     Note:  This document was prepared using Dragon voice recognition software and may include unintentional dictation errors.    Faythe Ghee, PA-C 06/14/18 1007    Emily Filbert, MD 06/14/18 [phone]

## 2018-06-14 NOTE — ED Triage Notes (Signed)
Pt c/o abscess to the left shoulder for the past 3-4 days

## 2018-06-14 NOTE — Discharge Instructions (Addendum)
Return to the emergency department in 2 days for recheck.  Take your medications as prescribed.

## 2018-06-16 ENCOUNTER — Other Ambulatory Visit: Payer: Self-pay

## 2018-06-16 ENCOUNTER — Encounter: Payer: Self-pay | Admitting: Emergency Medicine

## 2018-06-16 ENCOUNTER — Emergency Department
Admission: EM | Admit: 2018-06-16 | Discharge: 2018-06-16 | Disposition: A | Discharge status: Home / Self Care | Payer: Self-pay | Attending: Emergency Medicine | Admitting: Emergency Medicine

## 2018-06-16 DIAGNOSIS — Z09 Encounter for follow-up examination after completed treatment for conditions other than malignant neoplasm: Secondary | ICD-10-CM | POA: Insufficient documentation

## 2018-06-16 DIAGNOSIS — Z79899 Other long term (current) drug therapy: Secondary | ICD-10-CM | POA: Insufficient documentation

## 2018-06-16 DIAGNOSIS — R29898 Other symptoms and signs involving the musculoskeletal system: Secondary | ICD-10-CM | POA: Insufficient documentation

## 2018-06-16 DIAGNOSIS — J45909 Unspecified asthma, uncomplicated: Secondary | ICD-10-CM | POA: Insufficient documentation

## 2018-06-16 DIAGNOSIS — F17210 Nicotine dependence, cigarettes, uncomplicated: Secondary | ICD-10-CM | POA: Insufficient documentation

## 2018-06-16 NOTE — ED Triage Notes (Signed)
Here to have packing removed from left shoulder wound. NAD.

## 2018-06-16 NOTE — ED Notes (Signed)
Pt assessed by Provider, and wound treated and placed new bandage over pt wound

## 2018-06-16 NOTE — ED Provider Notes (Signed)
[facility] Emergency Department Provider Note ____________________________________________  Time seen: 0915  I have reviewed the triage vital signs and the nursing notes.  HISTORY  Chief Complaint  Wound Check  HPI Paul Mcdaniel is a 58 y.o. male returns to the ED for evaluation of an abscess that was I&D 2 days prior.  Patient denies any interim complaints.  He has had no fevers, chills, or sweats but he is here for packing removal as directed. He is also reporting some left shoulder ROM deficits, that he was told, if persisted, he should see a specialist.   Past Medical History:  Diagnosis Date  . Asthma   . Pneumonia 01/2016  . Pneumothorax     Patient Active Problem List   Diagnosis Date Noted  . Rectal bleeding 04/28/2016  . Abdominal pain 04/28/2016  . Nausea without vomiting 04/28/2016  . Sebaceous cyst 05/23/2013    Past Surgical History:  Procedure Laterality Date  . NONE TO DATE      Prior to Admission medications   Medication Sig Start Date End Date Taking? Authorizing Provider  azithromycin (ZITHROMAX Z-PAK) 250 MG tablet 2 pills today then 1 pill a day for 4 days 06/14/18   Sherrie Mustache Roselyn Bering, PA-C  doxycycline (VIBRA-TABS) 100 MG tablet Take 1 tablet (100 mg total) by mouth 2 (two) times daily. 09/23/16   Makayleigh Poliquin, Charlesetta Ivory, PA-C  HYDROcodone-acetaminophen (NORCO/VICODIN) 5-325 MG tablet Take 1 tablet by mouth every 6 (six) hours as needed for moderate pain. 06/14/18   Sherrie Mustache Roselyn Bering, PA-C  levothyroxine (SYNTHROID) 50 MCG tablet Take 1 tablet (50 mcg total) by mouth daily before breakfast. 09/23/16 10/23/16  Odyssey Vasbinder, Charlesetta Ivory, PA-C  omeprazole (PRILOSEC) 20 MG capsule Take 1 capsule (20 mg total) by mouth daily. 04/28/16   Anice Paganini, NP  promethazine (PHENERGAN) 25 MG tablet Take 1 tablet (25 mg total) by mouth as needed for nausea or vomiting. 04/28/16   Anice Paganini, NP    Allergies Ibuprofen; Clindamycin/lincomycin;  Doxycycline; Penicillins; and Sulfa antibiotics  Family History  Problem Relation Age of Onset  . Colon cancer Neg Hx     Social History Social History   Tobacco Use  . Smoking status: Current Some Day Smoker    Packs/day: 0.50    Types: Cigarettes  . Smokeless tobacco: Never Used  Substance Use Topics  . Alcohol use: No    Comment: Quit in 2015; Previously weekend/social drinker.  . Drug use: No    Review of Systems  Constitutional: Negative for fever. Eyes: Negative for visual changes. ENT: Negative for sore throat. Cardiovascular: Negative for chest pain. Respiratory: Negative for shortness of breath. Gastrointestinal: Negative for abdominal pain, vomiting and diarrhea. Genitourinary: Negative for dysuria. Musculoskeletal: Negative for back pain. LUE ROM deficits.  Skin: Negative for rash.  Left shoulder abscess, s/p post I&D  Neurological: Negative for headaches, focal weakness or numbness. ____________________________________________  PHYSICAL EXAM:  VITAL SIGNS: ED Triage Vitals  Enc Vitals Group     BP --      Pulse --      Resp --      Temp --      Temp src --      SpO2 --      Weight 06/16/18 0854 200 lb (90.7 kg)     Height 06/16/18 0854 5\' 7"  (1.702 m)     Head Circumference --      Peak Flow --  Pain Score 06/16/18 0853 2     Pain Loc --      Pain Edu? --      Excl. in GC? --     Constitutional: Alert and oriented. Well appearing and in no distress. Head: Normocephalic and atraumatic. Eyes: Conjunctivae are normal. Normal extraocular movements Cardiovascular: Normal rate, regular rhythm. Normal distal pulses. Respiratory: Normal respiratory effort.  Musculoskeletal: left UE without deformity. Patient with decreased AROM with extension above 100 degrees without recruitment of the scapular musculature.  Is also noted to have deficits with supraspinatus and infraspinatus testing.  Patient noted to have weakened pronation of the forearm on the  left, without evidence of a frank biceps tendon rupture.  Normal composite fist bilaterally.  Nontender with normal range of motion in all extremities.  Neurologic: Cranial nerves II through XII grossly intact.  Normal gross sensation noted.  Normal gait without ataxia. Normal speech and language. No gross focal neurologic deficits are appreciated. Skin:  Skin is warm, dry and intact. No rash noted.  Well-healed anterior upper chest cellulitis and abscess status post I&D procedure.  Informed dressing is in place.  Is removed easily without difficulty.  The wound flushes cleanly with saline.  The wound is covered with a sterile 2 x 2 gauze and a disposable bandage. Psychiatric: Mood and affect are normal. Patient exhibits appropriate insight and judgment. ____________________________________________  PROCEDURES  Procedures ____________________________________________  INITIAL IMPRESSION / ASSESSMENT AND PLAN / ED COURSE  Paul Mcdaniel was evaluated in Emergency Department on 06/16/2018 for the symptoms described in the history of present illness. He was evaluated in the context of the global COVID-19 pandemic, which necessitated consideration that the patient might be at risk for infection with the SARS-CoV-2 virus that causes COVID-19. Institutional protocols and algorithms that pertain to the evaluation of patients at risk for COVID-19 are in a state of rapid change based on information released by regulatory bodies including the CDC and federal and state organizations. These policies and algorithms were followed during the patient's care in the ED.  Patient with ED evaluation and wound check following I&D procedure.  The anterior chest wound appears to be healing well without difficulty. He will continue the antibiotics as prescribed.  Patient with some recent left upper extremity weakness.  He is leaving the ED today [facility].  [facility] some neruo symptoms including dizziness and syncope. I advised him to relay the LUE weakness as it does not appear to be related to a frank rotator cuff tear, without recent trauma.  ____________________________________________  FINAL CLINICAL IMPRESSION(S) / ED DIAGNOSES  Final diagnoses:  Encounter for recheck of abscess following incision and drainage  Weakness of left upper extremity      Aydon Swamy, Charlesetta IvoryJenise V Bacon, PA-C 06/16/18 1012    Jeanmarie PlantMcShane, James A, MD 06/16/18 1557

## 2018-06-16 NOTE — Discharge Instructions (Addendum)
Keep the wound clean, dry, and covered.  °

## 2019-01-07 IMAGING — MR MR HEAD W/O CM
10 series · 37 of 48 positions shown · non-contrast
Comparison: None.

CLINICAL DATA: 56-year-old male with left side headache for 2
weeks, dizziness, fatigue, blurred vision.

EXAM:
MRI HEAD WITHOUT CONTRAST
TECHNIQUE: Multiplanar, multiecho pulse sequences of the brain and surrounding
structures were obtained without intravenous contrast.

[Series 2: T1 · sagittal · 5.0mm · 0.47mm/px · 2 of 25 slices shown (1 of 2)]
[im 1/25]
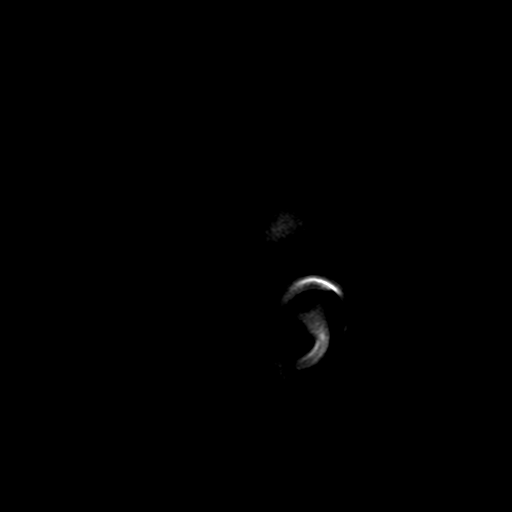
[im 25/25]
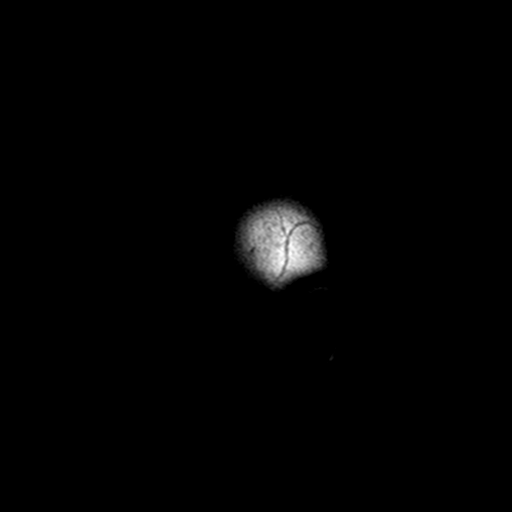

[Series 4: DWI · axial · 3.0mm · 0.94mm/px · z∈[-58,+95]mm · 5 of 52 slices shown (1 of 2)]
[im 1/52]
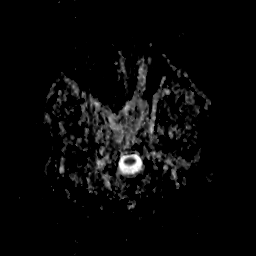
[im 13/52]
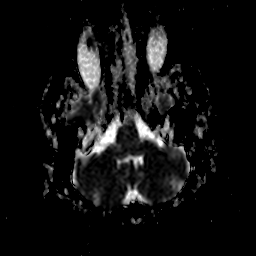
[im 26/52]
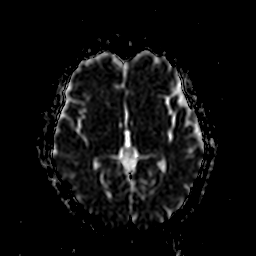
[im 39/52]
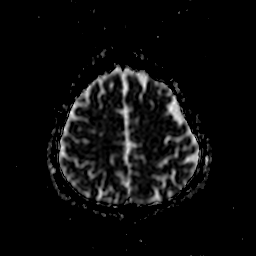
[im 52/52]
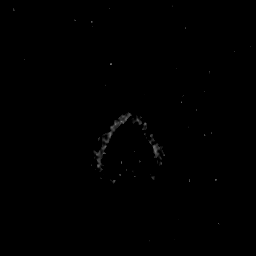

[Series 6: DWI · coronal · 5.0mm · 1.80mm/px · 4 of 39 slices shown (2 of 2)]
[im 1/39]
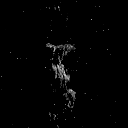
[im 13/39]
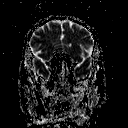
[im 26/39]
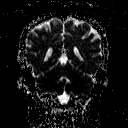
[im 39/39]
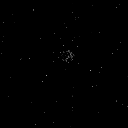

[Series 7: T2 · axial · 5.0mm · 0.45mm/px · z∈[-71,+97]mm · 2 of 25 slices shown (1 of 3)]
[im 1/25]
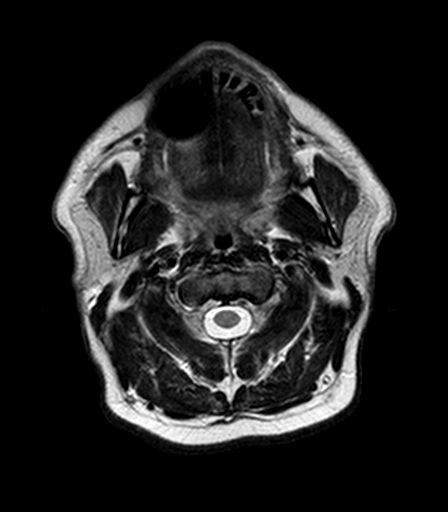
[im 25/25]
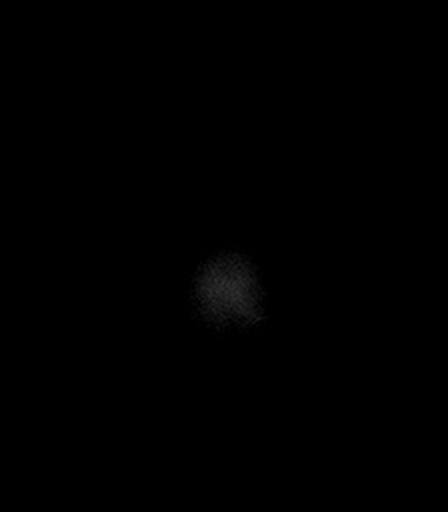

[Series 8: FLAIR · axial · 3.0mm · 0.90mm/px · z∈[-65,+88]mm · 5 of 52 slices shown]
[im 1/52]
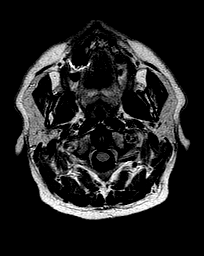
[im 13/52]
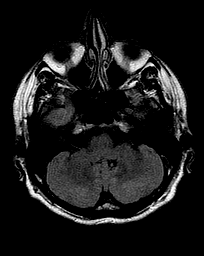
[im 26/52]
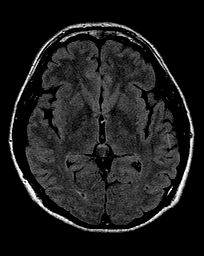
[im 39/52]
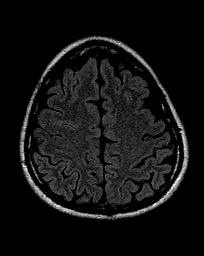
[im 52/52]
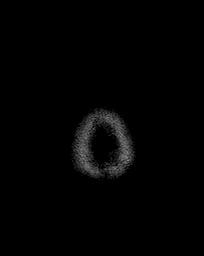

[Series 9: T2 · axial · 5.0mm · 0.45mm/px · z∈[-71,+97]mm · 2 of 25 slices shown (2 of 3)]
[im 1/25]
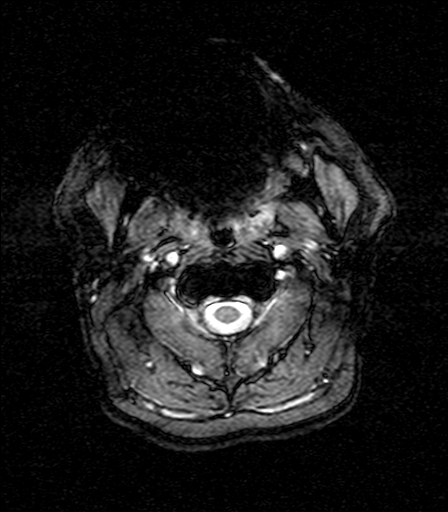
[im 25/25]
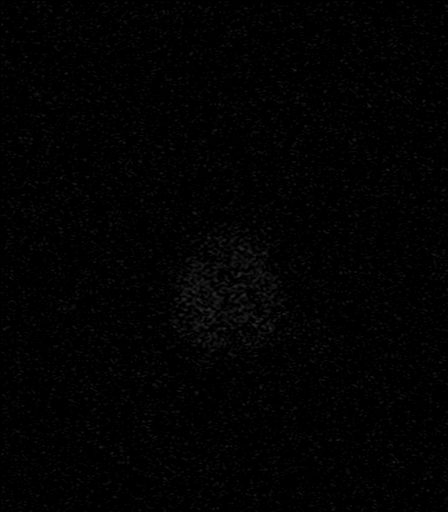

[Series 10: T1 · axial · 1.0mm · 0.45mm/px · z∈[-57,+92]mm · 8 of 160 slices shown (2 of 2)]
[im 11/160]
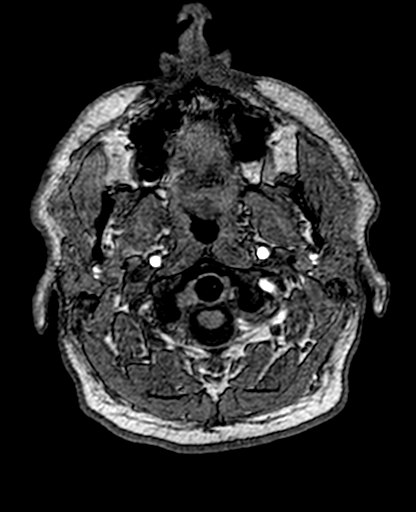
[im 32/160]
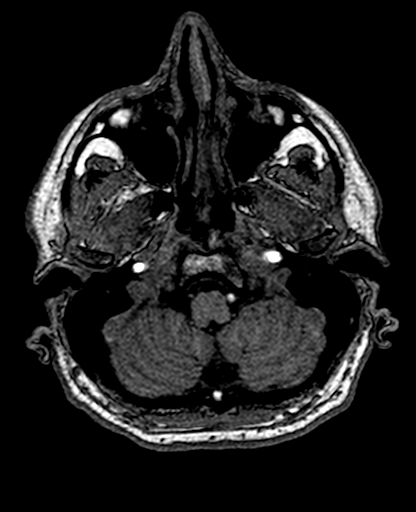
[im 54/160]
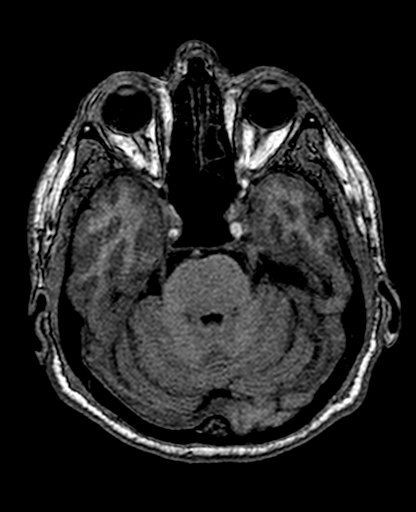
[im 75/160]
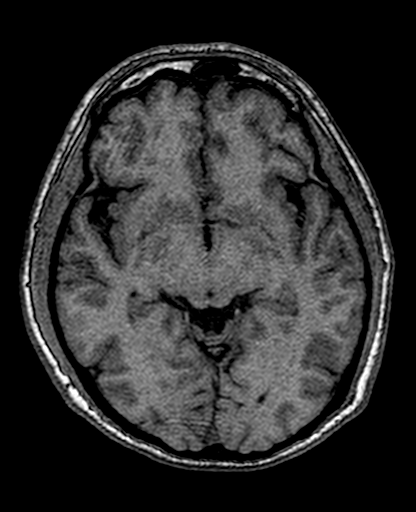
[im 96/160]
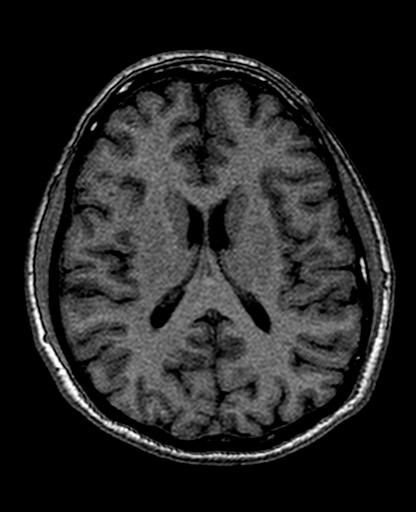
[im 117/160]
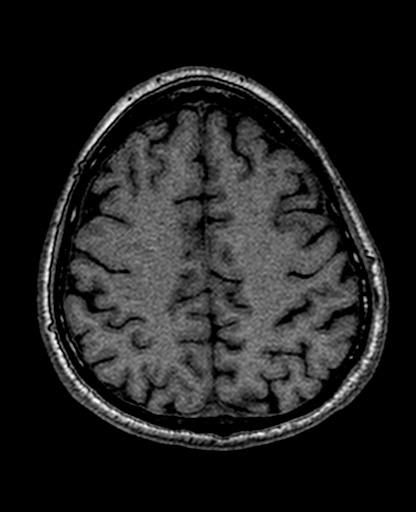
[im 138/160]
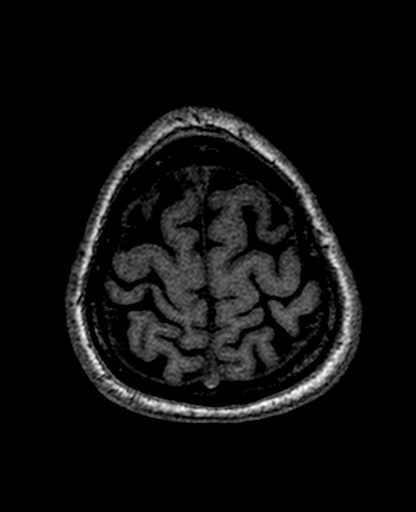
[im 160/160]
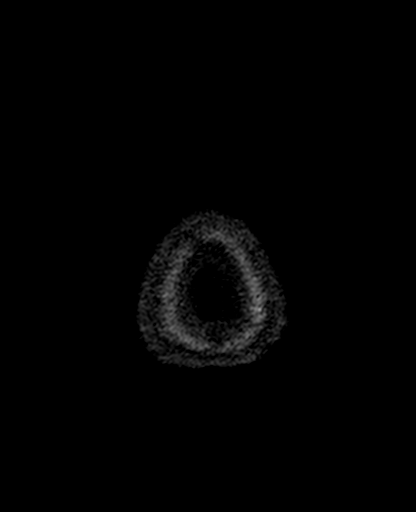

[Series 11: T2 · coronal · 5.0mm · 0.45mm/px · 3 of 29 slices shown (3 of 3)]
[im 1/29]
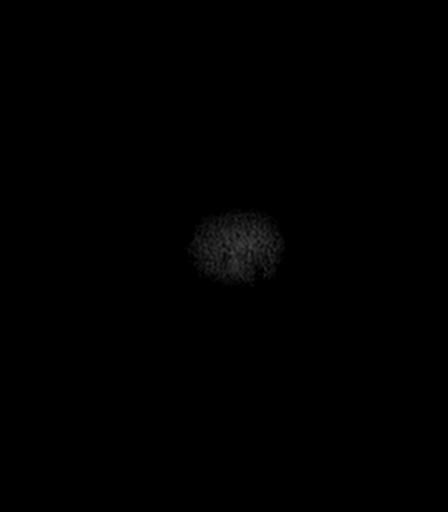
[im 15/29]
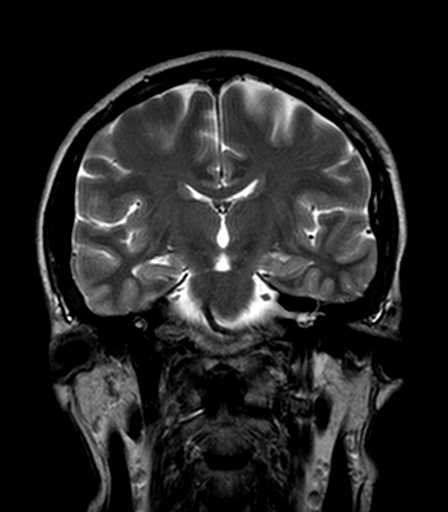
[im 29/29]
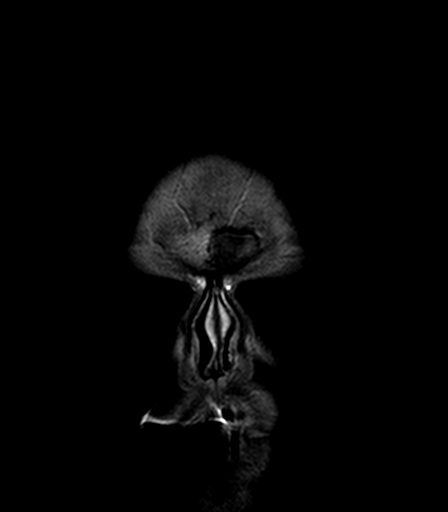

[Series 12: ax (id) · axial · 3.0mm · 0.94mm/px · z∈[-58,+89]mm · 5 of 50 slices shown]
[im 1/50]
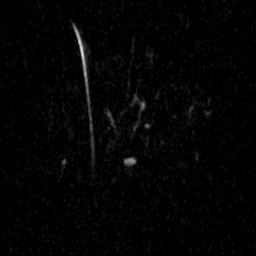
[im 13/50]
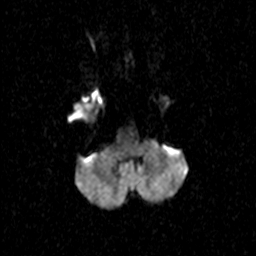
[im 25/50]
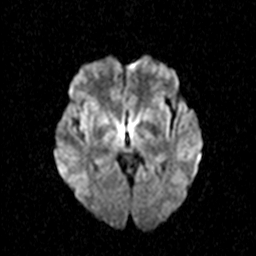
[im 37/50]
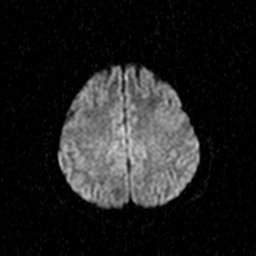
[im 50/50]
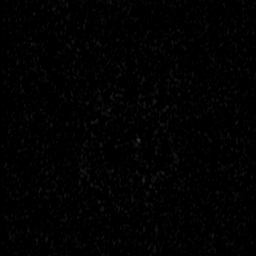

[Series 13: cor (id) · coronal · 5.0mm · 1.80mm/px · 1 of 36 slices shown]
[im 1/36]
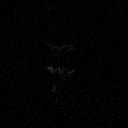

[37 of 48 positions shown; findings below may reference images not displayed]

FINDINGS: Brain: Normal cerebral volume. No restricted diffusion to suggest
acute infarction. No midline shift, mass effect, evidence of mass
lesion, ventriculomegaly, extra-axial collection or acute
intracranial hemorrhage. Cervicomedullary junction and pituitary are
within normal limits. Cystic change of the pineal gland up to 9 mm
with no regional mass effect is a normal variant (series 2, image
13). Gray and white matter signal is within normal limits throughout
the brain. No encephalomalacia or chronic cerebral blood products.

Vascular: Major intracranial vascular flow voids are preserved.
Dominant appearing distal left vertebral artery.

Skull and upper cervical spine: Negative visualized cervical spine.
Visualized bone marrow signal is within normal limits.

Sinuses/Orbits: Mucosal thickening and possibly a small fluid level
in the left frontal sinus (series 7, image 11). Mild left ethmoid
sinus mucosal thickening. Other paranasal sinuses and mastoids are
clear.

Other: Visible internal auditory structures appear normal. Negative
scalp soft tissues.
IMPRESSION: 1. Normal noncontrast Normal MRI appearance of the brain.
2. Mild left frontal frontal and ethmoid sinus inflammation with no
complicating features.

## 2019-11-10 ENCOUNTER — Ambulatory Visit: Payer: Medicaid Other | Admitting: Gerontology

## 2020-03-19 ENCOUNTER — Other Ambulatory Visit: Payer: Self-pay | Admitting: Internal Medicine

## 2020-03-19 DIAGNOSIS — G500 Trigeminal neuralgia: Secondary | ICD-10-CM

## 2020-03-19 DIAGNOSIS — G44229 Chronic tension-type headache, not intractable: Secondary | ICD-10-CM

## 2020-04-03 ENCOUNTER — Ambulatory Visit: Payer: Medicaid Other

## 2020-04-18 ENCOUNTER — Ambulatory Visit: Payer: Medicaid Other

## 2021-06-04 ENCOUNTER — Ambulatory Visit: Payer: Medicaid Other | Admitting: Internal Medicine
